# Patient Record
Sex: Female | Born: 1950 | Race: White | Hispanic: No | Marital: Married | Smoking: Never smoker
Health system: Southern US, Community
[De-identification: ages and names within clinical notes are randomized; demographics above are authoritative.]

## PROBLEM LIST (undated history)

## (undated) DIAGNOSIS — C801 Malignant (primary) neoplasm, unspecified: Secondary | ICD-10-CM

## (undated) DIAGNOSIS — N840 Polyp of corpus uteri: Secondary | ICD-10-CM

## (undated) DIAGNOSIS — J302 Other seasonal allergic rhinitis: Secondary | ICD-10-CM

## (undated) DIAGNOSIS — M8580 Other specified disorders of bone density and structure, unspecified site: Secondary | ICD-10-CM

## (undated) DIAGNOSIS — M533 Sacrococcygeal disorders, not elsewhere classified: Secondary | ICD-10-CM

## (undated) DIAGNOSIS — C73 Malignant neoplasm of thyroid gland: Secondary | ICD-10-CM

## (undated) DIAGNOSIS — L80 Vitiligo: Secondary | ICD-10-CM

## (undated) DIAGNOSIS — J45909 Unspecified asthma, uncomplicated: Secondary | ICD-10-CM

## (undated) DIAGNOSIS — H9192 Unspecified hearing loss, left ear: Secondary | ICD-10-CM

## (undated) DIAGNOSIS — D126 Benign neoplasm of colon, unspecified: Secondary | ICD-10-CM

## (undated) DIAGNOSIS — I700 Atherosclerosis of aorta: Secondary | ICD-10-CM

## (undated) DIAGNOSIS — K219 Gastro-esophageal reflux disease without esophagitis: Secondary | ICD-10-CM

## (undated) DIAGNOSIS — K589 Irritable bowel syndrome without diarrhea: Secondary | ICD-10-CM

## (undated) DIAGNOSIS — E079 Disorder of thyroid, unspecified: Secondary | ICD-10-CM

## (undated) HISTORY — DX: Malignant (primary) neoplasm, unspecified: C80.1

## (undated) HISTORY — DX: Benign neoplasm of colon, unspecified: D12.6

## (undated) HISTORY — PX: DILATION AND CURETTAGE OF UTERUS: SHX78

## (undated) HISTORY — DX: Other specified disorders of bone density and structure, unspecified site: M85.80

## (undated) HISTORY — DX: Other seasonal allergic rhinitis: J30.2

## (undated) HISTORY — PX: COLONOSCOPY: SHX174

## (undated) HISTORY — DX: Malignant neoplasm of thyroid gland: C73

## (undated) HISTORY — DX: Unspecified asthma, uncomplicated: J45.909

## (undated) HISTORY — DX: Vitiligo: L80

## (undated) HISTORY — DX: Polyp of corpus uteri: N84.0

## (undated) HISTORY — DX: Gastro-esophageal reflux disease without esophagitis: K21.9

## (undated) HISTORY — PX: SHOULDER SURGERY: SHX246

## (undated) HISTORY — DX: Irritable bowel syndrome, unspecified: K58.9

## (undated) HISTORY — DX: Unspecified hearing loss, left ear: H91.92

## (undated) HISTORY — DX: Atherosclerosis of aorta: I70.0

## (undated) HISTORY — DX: Disorder of thyroid, unspecified: E07.9

## (undated) HISTORY — DX: Sacrococcygeal disorders, not elsewhere classified: M53.3

---

## 2006-05-17 DIAGNOSIS — E079 Disorder of thyroid, unspecified: Secondary | ICD-10-CM

## 2006-05-17 DIAGNOSIS — C801 Malignant (primary) neoplasm, unspecified: Secondary | ICD-10-CM

## 2006-05-17 HISTORY — DX: Malignant (primary) neoplasm, unspecified: C80.1

## 2006-05-17 HISTORY — DX: Disorder of thyroid, unspecified: E07.9

## 2006-05-17 HISTORY — PX: TOTAL THYROIDECTOMY: SHX2547

## 2007-05-18 HISTORY — PX: OTHER SURGICAL HISTORY: SHX169

## 2012-08-02 ENCOUNTER — Encounter: Payer: Self-pay | Admitting: Internal Medicine

## 2012-08-28 ENCOUNTER — Encounter: Payer: Self-pay | Admitting: Obstetrics and Gynecology

## 2012-08-28 ENCOUNTER — Ambulatory Visit (INDEPENDENT_AMBULATORY_CARE_PROVIDER_SITE_OTHER): Payer: BC Managed Care – PPO | Admitting: Obstetrics and Gynecology

## 2012-08-28 VITALS — BP 128/70 | Ht 63.5 in | Wt 158.0 lb

## 2012-08-28 DIAGNOSIS — N951 Menopausal and female climacteric states: Secondary | ICD-10-CM

## 2012-08-28 DIAGNOSIS — Z01419 Encounter for gynecological examination (general) (routine) without abnormal findings: Secondary | ICD-10-CM

## 2012-08-28 DIAGNOSIS — Z1231 Encounter for screening mammogram for malignant neoplasm of breast: Secondary | ICD-10-CM

## 2012-08-28 NOTE — Patient Instructions (Signed)

## 2012-08-28 NOTE — Progress Notes (Signed)
Patient ID: Kelly Ingram, female   DOB: [birth year], 62 y.o.   MRN: [id number] 62 y.o.  Married  Caucasian female   [id number] here for annual exam.   Patient took HRT for ten years due to vasomotor symptoms.  Weaned off hormones for one year and hot flashes and night sweats are very strong.  Black cohosh did not work.  Taking a supplement called Symplex F which doesn't work.  Patient is on Synthroid due to thyroid cancer.  Wants to consider going back on HRT.    No LMP recorded. Patient is postmenopausal.          Sexually active: yes  The current method of family planning is post menopausal status.    Exercising:Yoga, stretches, walking  Last mammogram:  09-06-11 in [place]:wnl Last pap smear:07/2009 in [place] wnl History of abnormal pap: no history of abnormal paps Smoking:no Alcohol:3 glasses of wine per week Last colonoscopy:12 years ago in [place]:  Scheduled for 09/2012 with [facility] Last Bone Density:  3-4 yrs. ago Last tetanus shot:6 yrs. ago Last cholesterol check: unsure  Hgb: PCP               Urine:PCP    Health Maintenance  Topic Date Due  . Pap Smear  12/29/1966  . Tetanus/tdap  12/29/1967  . Mammogram  12/29/1998  . Colonoscopy  12/29/1998  . Zostavax  12/28/2008  . Influenza Vaccine  01/15/2013    Family History  Problem Relation Age of Onset  . Thyroid disease Mother   . Hypertension Paternal Grandmother   . Hypertension Paternal Grandfather     There is no problem list on file for this patient.   Past Medical History  Diagnosis Date  . Seasonal allergies   . Cancer 2008    thyroid  . Thyroid disease 2008    hx thyroid cancer--bilateral nodules    Past Surgical History  Procedure Laterality Date  . Total thyroidectomy  2008  . Vocal cord implant Right 2009  . Shoulder surgery Left     Allergies: Review of patient's allergies indicates no known allergies.  Current Outpatient Prescriptions  Medication Sig Dispense Refill  .  levothyroxine (SYNTHROID, LEVOTHROID) 125 MCG tablet Take 125 mcg by mouth daily before breakfast.      . loratadine (CLARITIN) 10 MG tablet Take 10 mg by mouth daily.       No current facility-administered medications for this visit.    ROS: Pertinent items are noted in HPI.  Social Hx:  Does consulting work.  Plans to increase time in [place].    Exam:    BP 128/70  Ht 5' 3.5" (1.613 m)  Wt 158 lb (71.668 kg)  BMI 27.55 kg/m2   Wt Readings from Last 3 Encounters:  08/28/12 158 lb (71.668 kg)     Ht Readings from Last 3 Encounters:  08/28/12 5' 3.5" (1.613 m)    General appearance: alert, cooperative and appears stated age Head: Normocephalic, without obvious abnormality, atraumatic Neck: no adenopathy, supple, symmetrical, trachea midline and thyroid not enlarged, symmetric, no tenderness/mass/nodules Lungs: clear to auscultation bilaterally Breasts: Inspection negative, No nipple retraction or dimpling, No nipple discharge or bleeding, No axillary or supraclavicular adenopathy, Normal to palpation without dominant masses Heart: regular rate and rhythm Abdomen: soft, non-tender; bowel sounds normal; no masses,  no organomegaly Extremities: extremities normal, atraumatic, no cyanosis or edema Skin: Skin color, texture, turgor normal. No rashes or lesions Lymph nodes: Cervical, supraclavicular, and axillary nodes normal. No abnormal  inguinal nodes palpated Neurologic: Grossly normal   Pelvic: External genitalia:  no lesions              Urethra:  normal appearing urethra with no masses, tenderness or lesions              Bartholins and Skenes: normal                 Vagina: normal appearing vagina with normal color and discharge, no lesions              Cervix: normal appearance              Pap taken: yes and HR HPV.        Bimanual Exam:  Uterus:  uterus is normal size, shape, consistency and nontender                                      Adnexa: normal adnexa in size,  nontender and no masses                                      Rectovaginal: Confirms                                      Anus:  normal sphincter tone, no lesions  A: normal gyn exam Menopausal symptoms.  Desires to return to HRT.  Risks and benefits and Women's Health Initiative have been discussed in detail with patient.  Declines Effexor or Zoloft.  [facility] and HR HPV testing return annually or prn   After mammogram is back and if normal, will prescribe MiniVelle .0375 mg twice a week and prometrium 100 mg daily.  An After Visit Summary was printed and given to the patient.

## 2012-08-30 ENCOUNTER — Encounter: Payer: Self-pay | Admitting: Obstetrics and Gynecology

## 2012-08-31 ENCOUNTER — Other Ambulatory Visit: Payer: Self-pay | Admitting: Obstetrics and Gynecology

## 2012-08-31 ENCOUNTER — Encounter: Payer: Self-pay | Admitting: Obstetrics and Gynecology

## 2012-08-31 DIAGNOSIS — N951 Menopausal and female climacteric states: Secondary | ICD-10-CM

## 2012-08-31 MED ORDER — ESTRADIOL 0.0375 MG/24HR TD PTTW: 1.0000 | MEDICATED_PATCH | TRANSDERMAL | Status: DC

## 2012-08-31 MED ORDER — PROGESTERONE MICRONIZED 100 MG PO CAPS: 100.0000 mg | ORAL_CAPSULE | Freq: Every day | ORAL | Status: DC

## 2012-09-01 ENCOUNTER — Encounter: Payer: Self-pay | Admitting: Obstetrics and Gynecology

## 2012-09-04 ENCOUNTER — Other Ambulatory Visit: Payer: Self-pay | Admitting: Obstetrics and Gynecology

## 2012-09-04 ENCOUNTER — Telehealth: Payer: Self-pay | Admitting: Orthopedic Surgery

## 2012-09-04 DIAGNOSIS — N951 Menopausal and female climacteric states: Secondary | ICD-10-CM

## 2012-09-04 MED ORDER — PROGESTERONE MICRONIZED 100 MG PO CAPS: 100.0000 mg | ORAL_CAPSULE | Freq: Every day | ORAL | Status: DC

## 2012-09-04 MED ORDER — ESTRADIOL 0.0375 MG/24HR TD PTTW: 1.0000 | MEDICATED_PATCH | TRANSDERMAL | Status: DC

## 2012-09-04 NOTE — Telephone Encounter (Signed)
Spoke with pt to let her know we sent her RX for prometrium and minivelle for 1 month supply to [facility]. Pt appreciative.

## 2012-09-04 NOTE — Telephone Encounter (Signed)
LMTCB about the pharmacy she uses so we can refill her medications.

## 2012-09-05 NOTE — Telephone Encounter (Signed)
pt is waiting on prior authorization pt wants to know if she should start taking the other medication while she is waiting

## 2012-09-06 ENCOUNTER — Telehealth: Payer: Self-pay | Admitting: Obstetrics and Gynecology

## 2012-09-06 NOTE — Telephone Encounter (Signed)
Kennon Rounds,  Thank you.  I just went to the prescription samples to check for the [facility] as well!  We are on the same page.  [employer]

## 2012-09-06 NOTE — Telephone Encounter (Signed)
cancel

## 2012-09-06 NOTE — Telephone Encounter (Signed)
Pt returning Amy's call re: prior auth status/[place]

## 2012-09-06 NOTE — Telephone Encounter (Signed)
Please advise. Will start prior auth process today. See bellow patient question. Will send in [place] today. sue

## 2012-09-06 NOTE — Telephone Encounter (Signed)
Patient still waiting on prior authorization for Minivelle.  Meanwhile, hot flashes very bothersome so she wants to know if she may begin Prometrium or if this would be changed if Minivelle does not get authorized. Advised she could try sample of Minivelle but have no way of knowing if she will be starting a medication that may ultimately need to be changed if prior auth does not go thru.  Also advised to begin Prometrium when starts the samples.  One month supply Minivelle 0.0375mg  left at desk for patient with savings card. [id number] exp 06-2013, 2 boxes.

## 2012-09-07 ENCOUNTER — Encounter: Payer: Self-pay | Admitting: Internal Medicine

## 2012-09-07 ENCOUNTER — Ambulatory Visit (AMBULATORY_SURGERY_CENTER): Payer: BC Managed Care – PPO | Admitting: *Deleted

## 2012-09-07 ENCOUNTER — Telehealth: Payer: Self-pay | Admitting: *Deleted

## 2012-09-07 VITALS — Ht 64.0 in | Wt 157.6 lb

## 2012-09-07 DIAGNOSIS — Z1211 Encounter for screening for malignant neoplasm of colon: Secondary | ICD-10-CM

## 2012-09-07 MED ORDER — MOVIPREP 100 G PO SOLR: 1.0000 | Freq: Once | ORAL | Status: DC

## 2012-09-07 NOTE — Telephone Encounter (Signed)
Please advise on prior authorization for Minivelle patch. Will fax when complete. ?chart location. See phone note in EPIC. sue

## 2012-09-07 NOTE — Progress Notes (Signed)
No egg or soy allergy. ewm No problems with sedation in the past. ewm

## 2012-09-08 NOTE — Telephone Encounter (Signed)
Kelly Ingram,  Thank you for sending in the prior authorization.  [employer]

## 2012-09-11 ENCOUNTER — Encounter: Payer: Self-pay | Admitting: Internal Medicine

## 2012-09-13 ENCOUNTER — Telehealth: Payer: Self-pay

## 2012-09-13 MED ORDER — DICYCLOMINE HCL 20 MG PO TABS: 20.0000 mg | ORAL_TABLET | Freq: Four times a day (QID) | ORAL | Status: DC

## 2012-09-13 NOTE — Telephone Encounter (Signed)
Pt aware and script sent to the pharmacy. 

## 2012-09-13 NOTE — Telephone Encounter (Signed)
Pt would like the prescriptions for the antispasmodics now so she will not have to go to the pharmacy post procedure. Please advise.

## 2012-09-13 NOTE — Telephone Encounter (Signed)
We can prescribe antispasmodics post procedure to help her "gut calm down"

## 2012-09-13 NOTE — Telephone Encounter (Signed)
Pt is scheduled for a colon May 8th. 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 My stools are soft and I have 2-4 bowel movements a day. The last time I had a colonoscopy, it took a week for my gut to "calm down." How can I modify prep this time so it will be less harsh on me?   I replied that we usually do not modify the prep but the pt sent another request today stating the following:     Bonita Quin - thanks for your reply. I think all health care should be tailored to a patient's individual needs, particularly when there's a history of problems. Prior to my prep and procedure, I'd like to discuss my case with Dr. Marina Goodell either by Earleen Reaper, phone or an appointment.   Dr. Marina Goodell please advise.

## 2012-09-13 NOTE — Telephone Encounter (Signed)
Bentyl 20 mg, dispense 30. One by mouth every 6 hours when necessary. No refills

## 2012-09-19 ENCOUNTER — Encounter: Payer: Self-pay | Admitting: Obstetrics and Gynecology

## 2012-09-21 ENCOUNTER — Encounter: Payer: Self-pay | Admitting: Internal Medicine

## 2012-09-21 ENCOUNTER — Ambulatory Visit (AMBULATORY_SURGERY_CENTER): Payer: BC Managed Care – PPO | Admitting: Internal Medicine

## 2012-09-21 VITALS — BP 128/69 | HR 66 | Temp 97.2°F | Resp 17 | Ht 64.0 in | Wt 157.0 lb

## 2012-09-21 DIAGNOSIS — Z1211 Encounter for screening for malignant neoplasm of colon: Secondary | ICD-10-CM

## 2012-09-21 MED ORDER — SODIUM CHLORIDE 0.9 % IV SOLN: 500.0000 mL | INTRAVENOUS | Status: DC

## 2012-09-21 NOTE — Progress Notes (Signed)
Patient did not have preoperative order for IV antibiotic SSI prophylaxis. (G8918)  Patient did not experience any of the following events: a burn prior to discharge; a fall within the facility; wrong site/side/patient/procedure/implant event; or a hospital transfer or hospital admission upon discharge from the facility. (G8907)  

## 2012-09-21 NOTE — Progress Notes (Deleted)
YOU HAD AN ENDOSCOPIC PROCEDURE TODAY AT THE  ENDOSCOPY CENTER: Refer to the procedure report that was given to you for any specific questions about what was found during the examination.  If the procedure report does not answer your questions, please call your gastroenterologist to clarify.  If you requested that your care partner not be given the details of your procedure findings, then the procedure report has been included in a sealed envelope for you to review at your convenience later.  YOU SHOULD EXPECT: Some feelings of bloating in the abdomen. Passage of more gas than usual.  Walking can help get rid of the air that was put into your GI tract during the procedure and reduce the bloating. If you had a lower endoscopy (such as a colonoscopy or flexible sigmoidoscopy) you may notice spotting of blood in your stool or on the toilet paper. If you underwent a bowel prep for your procedure, then you may not have a normal bowel movement for a few days.  DIET: Your first meal following the procedure should be a light meal and then it is ok to progress to your normal diet.  A half-sandwich or bowl of soup is an example of a good first meal.  Heavy or fried foods are harder to digest and may make you feel nauseous or bloated.  Likewise meals heavy in dairy and vegetables can cause extra gas to form and this can also increase the bloating.  Drink plenty of fluids but you should avoid alcoholic beverages for 24 hours.  TRY TO INCREASE THE FIBER IN YOUR DIET.  ACTIVITY: Your care partner should take you home directly after the procedure.  You should plan to take it easy, moving slowly for the rest of the day.  You can resume normal activity the day after the procedure however you should NOT DRIVE or use heavy machinery for 24 hours (because of the sedation medicines used during the test).    SYMPTOMS TO REPORT IMMEDIATELY: A gastroenterologist can be reached at any hour.  During normal business hours,  8:30 AM to 5:00 PM Monday through Friday, call [phone].  After hours and on weekends, please call the GI answering service at [phone] who will take a message and have the physician on call contact you.   Following lower endoscopy (colonoscopy or flexible sigmoidoscopy):  Excessive amounts of blood in the stool  Significant tenderness or worsening of abdominal pains  Swelling of the abdomen that is new, acute  Fever of 100F or higher  FOLLOW UP: If any biopsies were taken you will be contacted by phone or by letter within the next 1-3 weeks.  Call your gastroenterologist if you have not heard about the biopsies in 3 weeks.  Our staff will call the home number listed on your records the next business day following your procedure to check on you and address any questions or concerns that you may have at that time regarding the information given to you following your procedure. This is a courtesy call and so if there is no answer at the home number and we have not heard from you through the emergency physician on call, we will assume that you have returned to your regular daily activities without incident.  SIGNATURES/CONFIDENTIALITY: You and/or your care partner have signed paperwork which will be entered into your electronic medical record.  These signatures attest to the fact that that the information above on your After Visit Summary has been reviewed and is   understood.  Full responsibility of the confidentiality of this discharge information lies with you and/or your care-partner. 

## 2012-09-21 NOTE — Op Note (Signed)
[facility] [address] [place], [address]   COLONOSCOPY PROCEDURE REPORT  PATIENT: Kelly Ingram, Kelly Ingram  MR#: [id number] BIRTHDATE: 10-01-1950 , 61  yrs. old GENDER: Female ENDOSCOPIST: Roxy Cedar, MD REFERRED MW:[id number] Evlyn Kanner, M.D. PROCEDURE DATE:  09/21/2012 PROCEDURE:   Colonoscopy, screening ASA CLASS:   Class II INDICATIONS:Average risk patient for colon cancer.   Negative exam elsewhere 12 yrs ago MEDICATIONS: MAC sedation, administered by CRNA and propofol (Diprivan) 200mg  IV  DESCRIPTION OF PROCEDURE:   After the risks benefits and alternatives of the procedure were thoroughly explained, informed consent was obtained.  A digital rectal exam revealed no abnormalities of the rectum.   The LB [id number]  endoscope was introduced through the anus and advanced to the cecum, which was identified by both the appendix and ileocecal valve. No adverse events experienced.   The quality of the prep was excellent, using MoviPrep  The instrument was then slowly withdrawn as the colon was fully examined.      COLON FINDINGS: Mild diverticulosis was noted in the sigmoid colon. The colon was otherwise normal.  There was no   inflammation, polyps or cancers .  Retroflexed views revealed internal hemorrhoids. The time to cecum=4 minutes 33 seconds.  Withdrawal time=8 minutes 48 seconds.  The scope was withdrawn and the procedure completed.  COMPLICATIONS: There were no complications.  ENDOSCOPIC IMPRESSION: 1.   Mild diverticulosis was noted in the sigmoid colon 2.   The colon was otherwise normal  RECOMMENDATIONS: 1. Continue current colorectal screening recommendations for "routine risk" patients with a repeat colonoscopy in 10 years.   eSigned:  Roxy Cedar, MD 09/21/2012 11:03 AM   cc: Adrian Prince, MD and The Patient

## 2012-09-21 NOTE — Patient Instructions (Addendum)
YOU HAD AN ENDOSCOPIC PROCEDURE TODAY AT THE [facility]: Refer to the procedure report that was given to you for any specific questions about what was found during the examination.  If the procedure report does not answer your questions, please call your gastroenterologist to clarify.  If you requested that your care partner not be given the details of your procedure findings, then the procedure report has been included in a sealed envelope for you to review at your convenience later.  YOU SHOULD EXPECT: Some feelings of bloating in the abdomen. Passage of more gas than usual.  Walking can help get rid of the air that was put into your GI tract during the procedure and reduce the bloating. If you had a lower endoscopy (such as a colonoscopy or flexible sigmoidoscopy) you may notice spotting of blood in your stool or on the toilet paper. If you underwent a bowel prep for your procedure, then you may not have a normal bowel movement for a few days.  DIET: Your first meal following the procedure should be a light meal and then it is ok to progress to your normal diet.  A half-sandwich or bowl of soup is an example of a good first meal.  Heavy or fried foods are harder to digest and may make you feel nauseous or bloated.  Likewise meals heavy in dairy and vegetables can cause extra gas to form and this can also increase the bloating.  Drink plenty of fluids but you should avoid alcoholic beverages for 24 hours. PLEASE, TRY TO INCREASE THE FIBER IN YOUR DIET.  ACTIVITY: Your care partner should take you home directly after the procedure.  You should plan to take it easy, moving slowly for the rest of the day.  You can resume normal activity the day after the procedure however you should NOT DRIVE or use heavy machinery for 24 hours (because of the sedation medicines used during the test).    SYMPTOMS TO REPORT IMMEDIATELY: A gastroenterologist can be reached at any hour.  During normal business  hours, 8:30 AM to 5:00 PM Monday through Friday, call [phone].  After hours and on weekends, please call the GI answering service at [phone] who will take a message and have the physician on call contact you.   Following lower endoscopy (colonoscopy or flexible sigmoidoscopy):  Excessive amounts of blood in the stool  Significant tenderness or worsening of abdominal pains  Swelling of the abdomen that is new, acute  Fever of 100F or higher  FOLLOW UP: If any biopsies were taken you will be contacted by phone or by letter within the next 1-3 weeks.  Call your gastroenterologist if you have not heard about the biopsies in 3 weeks.  Our staff will call the home number listed on your records the next business day following your procedure to check on you and address any questions or concerns that you may have at that time regarding the information given to you following your procedure. This is a courtesy call and so if there is no answer at the home number and we have not heard from you through the emergency physician on call, we will assume that you have returned to your regular daily activities without incident.  SIGNATURES/CONFIDENTIALITY: You and/or your care partner have signed paperwork which will be entered into your electronic medical record.  These signatures attest to the fact that that the information above on your After Visit Summary has been reviewed and is  understood.  Full responsibility of the confidentiality of this discharge information lies with you and/or your care-partner. 

## 2012-09-22 ENCOUNTER — Telehealth: Payer: Self-pay | Admitting: *Deleted

## 2012-09-22 NOTE — Telephone Encounter (Signed)
  Follow up Call-  Call back number 09/21/2012  Post procedure Call Back phone  # [phone]  Permission to leave phone message Yes     Patient questions:  Do you have a fever, pain , or abdominal swelling? no Pain Score  0 *  Have you tolerated food without any problems? yes  Have you been able to return to your normal activities? yes  Do you have any questions about your discharge instructions: Diet   no Medications  no Follow up visit  no  Do you have questions or concerns about your Care? no  Actions: * If pain score is 4 or above: No action needed, pain <4.

## 2012-09-22 NOTE — Telephone Encounter (Signed)
  Follow up Call-  Call back number 09/21/2012  Post procedure Call Back phone  # [phone]  Permission to leave phone message Yes     Patient questions:  Do you have a fever, pain , or abdominal swelling? no Pain Score  0 *  Have you tolerated food without any problems? Yes   Have you been able to return to your normal activities? yes  Do you have any questions about your discharge instructions: Diet   no Medications  no Follow up visit  no  Do you have questions or concerns about your Care? no  Actions: * If pain score is 4 or above: No action needed, pain <4.

## 2012-09-28 ENCOUNTER — Ambulatory Visit
Admission: RE | Admit: 2012-09-28 | Discharge: 2012-09-28 | Disposition: A | Discharge status: Home / Self Care | Payer: BC Managed Care – PPO | Source: Ambulatory Visit | Attending: Obstetrics and Gynecology | Admitting: Obstetrics and Gynecology

## 2012-09-28 DIAGNOSIS — Z1231 Encounter for screening mammogram for malignant neoplasm of breast: Secondary | ICD-10-CM

## 2012-10-05 ENCOUNTER — Other Ambulatory Visit: Payer: Self-pay | Admitting: Obstetrics and Gynecology

## 2012-10-05 ENCOUNTER — Telehealth: Payer: Self-pay | Admitting: Obstetrics and Gynecology

## 2012-10-05 DIAGNOSIS — N951 Menopausal and female climacteric states: Secondary | ICD-10-CM

## 2012-10-05 MED ORDER — PROGESTERONE MICRONIZED 100 MG PO CAPS: 100.0000 mg | ORAL_CAPSULE | Freq: Every day | ORAL | Status: DC

## 2012-10-05 MED ORDER — ESTRADIOL 0.05 MG/24HR TD PTWK: 1.0000 | MEDICATED_PATCH | TRANSDERMAL | Status: DC

## 2012-10-05 NOTE — Telephone Encounter (Signed)
Spoke with Kelly Ingram about MMG results being normal. Rec screening MMG in a year. Kelly Ingram pleased. Kelly Ingram doing much better on HRT. Night sweats and hot flashes diminishing. Kelly Ingram needs refill of prometrium, will take last one tonight. Kelly Ingram was using samples of Minivelle. Kelly Ingram says insurance won't cover minivelle, and Dr. Edward Jolly had mentioned another patch that could be used weekly instead. Please advise.

## 2012-10-05 NOTE — Telephone Encounter (Signed)
Please inform patient I will send RX to pharmacy for Prometrium and Climara patch (to be used weekly).

## 2012-10-05 NOTE — Telephone Encounter (Signed)
Patient needs prescription for Prometrium plus her "patch" (on last pill of Prometrium).  Also needs results of mammogram.

## 2012-10-06 ENCOUNTER — Other Ambulatory Visit: Payer: Self-pay | Admitting: Obstetrics and Gynecology

## 2012-10-06 NOTE — Telephone Encounter (Signed)
I just spoke to the patient personally by phone to let her know that I confirmed the Prometrium is now available at her [facility].  The order that I had placed for this did not go through as I had not signed the final order.  I apologized to the patient for the delay.

## 2012-10-06 NOTE — Telephone Encounter (Signed)
LM on pt's VM that Rx's were sent to pharmacy. Pt to call back with any problems.

## 2012-10-06 NOTE — Telephone Encounter (Signed)
Patient stated that she used MyChart to document that she was having issues getting her medications refilled. She stated that she communicated most recently with Bonita Quin concerning this matter. Patient stated that she has been waiting to hear back from Amy, but that Amy never contacted her back. Patient stated that after she spoke with Bonita Quin and made it known that she needed both of her medications refilled, she did get the estrogen patch filled. However, the prometrium still needs to be filled. Patient stated that she uses the [facility] on [address] and that she is leaving to go out of town two hours from now. [place]

## 2012-10-17 ENCOUNTER — Other Ambulatory Visit: Payer: Self-pay | Admitting: Endocrinology

## 2012-10-17 DIAGNOSIS — E890 Postprocedural hypothyroidism: Secondary | ICD-10-CM

## 2013-01-23 ENCOUNTER — Ambulatory Visit
Admission: RE | Admit: 2013-01-23 | Discharge: 2013-01-23 | Disposition: A | Discharge status: Home / Self Care | Payer: BC Managed Care – PPO | Source: Ambulatory Visit | Attending: Endocrinology | Admitting: Endocrinology

## 2013-01-23 DIAGNOSIS — E890 Postprocedural hypothyroidism: Secondary | ICD-10-CM

## 2013-03-22 ENCOUNTER — Other Ambulatory Visit: Payer: Self-pay

## 2013-05-24 ENCOUNTER — Other Ambulatory Visit: Payer: Self-pay | Admitting: Obstetrics and Gynecology

## 2013-05-24 ENCOUNTER — Telehealth: Payer: Self-pay | Admitting: Obstetrics and Gynecology

## 2013-05-24 DIAGNOSIS — N950 Postmenopausal bleeding: Secondary | ICD-10-CM

## 2013-05-24 NOTE — Telephone Encounter (Signed)
I will put in an order for a sonohysterogram and an endometrial biopsy. Please let the patient know that we will likely be doing all of these when she comes in for her visit with me.

## 2013-05-24 NOTE — Telephone Encounter (Signed)
Call to patient and notified of Dr Elza Rafter plan to schedule for PUS/poss SHGM and endo BX when here. Take motrin 800 mg 1 hour prior with food. Chart to precert.

## 2013-05-24 NOTE — Telephone Encounter (Signed)
Return call to patient.  She reports menopausal for last 12 years. Started spotting about 2 months ago. Has increased to actual red flow over last two weeks.  Using a pad now but still not heavy. Needs one pad per day. Was dark and just on tissue. Has had this happen before several years ago and needed out-patient surgical procedure but it did stop the bleeding. She is traveling in [place] and will not be home till late on 06-04-13. Advised if begins heavy bleeding seek care at ED or urgent care. Agreeable.  Appt scheduled with Dr Quincy Simmonds on 06-07-13 at 1100.  (this is an ultrasound slot but was first available when she returns to town).  Anything else to be done? Should I schedule PUS?

## 2013-05-24 NOTE — Telephone Encounter (Signed)
Patient calling re: has been having "spotting and bleeding for last several weeks." Patient is post menopausal. Patient is out of town and won't be back for another two weeks.

## 2013-05-25 NOTE — Telephone Encounter (Signed)
Call to patient/ advised of $25 PR for visit/also advised that appointment has been moved to 10am and that there is a $150 cancellation penalty//ssf

## 2013-05-25 NOTE — Telephone Encounter (Signed)
Call to patient, advised

## 2013-06-07 ENCOUNTER — Telehealth: Payer: Self-pay | Admitting: Obstetrics and Gynecology

## 2013-06-07 ENCOUNTER — Ambulatory Visit (INDEPENDENT_AMBULATORY_CARE_PROVIDER_SITE_OTHER): Payer: BC Managed Care – PPO

## 2013-06-07 ENCOUNTER — Ambulatory Visit: Payer: BC Managed Care – PPO | Admitting: Obstetrics and Gynecology

## 2013-06-07 ENCOUNTER — Ambulatory Visit (INDEPENDENT_AMBULATORY_CARE_PROVIDER_SITE_OTHER): Payer: BC Managed Care – PPO | Admitting: Obstetrics and Gynecology

## 2013-06-07 DIAGNOSIS — N950 Postmenopausal bleeding: Secondary | ICD-10-CM

## 2013-06-07 NOTE — Progress Notes (Signed)
  Subjective  Patient is here for pelvic ultrasound, sonohysterogram, and endometrial biopsy for postmenopausal bleeding on HRT. Patient re-initiated HRT in May 2014 after approximately one year hiatus off HRT.  Had terrible hot flashes and night sweats and requesting restarting HRT. After complete discussion regarding risks and benefits, started HRT again.  On Climara 0.05 mg patch weekly and Prometirum 100 mg daily.  No missed dosages or problems with patch falling off. Bled briefly in the Fall 2014 and then had bled for the past month.  States history of hysteroscopy with dilation and curettage in [place] for what she believes was an endometrial polyp.   Wondering if needs to come off HRT.  Objective  Ultrasound performed.  Uterus 7.96 x 4.32 x 4.19 cm.  EMS 7.3 mm, feeder vessel noted.  Fibroid 1.6 x 1.36 x 1.56 cm intramural.  Left ovary 1.94 x 1.3 x 0.82 cm.  Right ovary 2.13 x 0.91 x 0.94 cm.  Sonohysterogram and EMB  Consent for procedures performed. Speculum placed in vagina. Cervix sterilized with betadine.  Cannula placed in uterine cavity without difficulty. Speculum withdrawn. Saline injected. Filling defect noted.  Speculum replaced. Cervix resterilized. Tenaculum to anterior cervical lip. Pipelle passed to 8 cm twice. Tissue to pathology. Minimal EBL. No complications to procedures.  Assessment  Postmenopausal bleeding. Suspected endometrial polyp. History of prior hysteroscopy with dilation and curettage.  Plan  Patient informed that she may get a phone call next week in my absence regarding her test results. She will follow up with me in approx. 10 days for a talk.

## 2013-06-07 NOTE — Telephone Encounter (Signed)
Patient needs a consult appointment with Dr. Quincy Simmonds (06/22/13 thru 06/22/13 ) to discuss biopsy results. No appointments available.

## 2013-06-07 NOTE — Patient Instructions (Signed)
Call for fever, increasing pain, or heavy bleeding.

## 2013-06-08 NOTE — Telephone Encounter (Signed)
Spoke with patient. Office visit scheduled for 2/4 at 0815 with Dr. Quincy Simmonds. Patient aware.  Time per Gay Filler, Dr. Quincy Simmonds does not have to make rounds that morning.  Routing to provider for final review. Patient agreeable to disposition. Will close encounter

## 2013-06-08 NOTE — Telephone Encounter (Signed)
Thank you for using my time slots well.

## 2013-06-08 NOTE — Telephone Encounter (Signed)
Patient calling to check on status of request. She is available anytime the week of 06/22/13. No slots have opened up at this time.

## 2013-06-11 LAB — IPS CERVICAL/ECC/EMB/VULVAR/VAGINAL BIOPSY

## 2013-06-15 ENCOUNTER — Telehealth: Payer: Self-pay | Admitting: Obstetrics and Gynecology

## 2013-06-15 NOTE — Telephone Encounter (Signed)
Spoke with patient and message from Dr. Sabra Heck given.  Has follow up appointment with Dr. Quincy Simmonds.  Routing to provider for final review. Patient agreeable to disposition. Will close encounter

## 2013-06-15 NOTE — Telephone Encounter (Signed)
Pt had a biopsy last week and is calling to get her results

## 2013-06-15 NOTE — Telephone Encounter (Signed)
Agree.  Encounter closed. 

## 2013-06-15 NOTE — Telephone Encounter (Signed)
Message copied by Michele Mcalpine on Fri Jun 15, 2013 10:33 AM ------      Message from: Megan Salon      Created: Wed Jun 13, 2013  4:13 PM       Inform pt biopsy showed no abnormal cells but an endometrial polyp.  She has follow up appointment scheduled with Dr. Quincy Simmonds to discuss the next step. ------

## 2013-06-17 DIAGNOSIS — N840 Polyp of corpus uteri: Secondary | ICD-10-CM

## 2013-06-17 HISTORY — DX: Polyp of corpus uteri: N84.0

## 2013-06-20 ENCOUNTER — Ambulatory Visit (INDEPENDENT_AMBULATORY_CARE_PROVIDER_SITE_OTHER): Payer: BC Managed Care – PPO | Admitting: Obstetrics and Gynecology

## 2013-06-20 ENCOUNTER — Encounter: Payer: Self-pay | Admitting: Obstetrics and Gynecology

## 2013-06-20 ENCOUNTER — Telehealth: Payer: Self-pay | Admitting: *Deleted

## 2013-06-20 VITALS — BP 123/82 | HR 61 | Resp 14 | Wt 159.0 lb

## 2013-06-20 DIAGNOSIS — N950 Postmenopausal bleeding: Secondary | ICD-10-CM

## 2013-06-20 DIAGNOSIS — N840 Polyp of corpus uteri: Secondary | ICD-10-CM

## 2013-06-20 NOTE — Telephone Encounter (Signed)
Call to patient and advised that due to hospital availability, unable to move surgery up at this point.  Plan to proceed with 07-03-13 at 1215 and if there is an cancellation, we will move her up.  Instructions given and will mail copy.  Patient declined pre-op stating that Dr Quincy Simmonds was very through with her today and took care of all info related to surgery. Post op scheduled. Advised will confirm info regarding pre-op with Dr Quincy Simmonds.

## 2013-06-20 NOTE — Progress Notes (Signed)
Patient ID: Kelly Ingram, female   DOB: 1951-04-08, 63 y.o.   MRN: [id number]  Patient is here for follow up of pelvic ultrasound, sonohysterogram, and endometrial biopsy for postmenopausal bleeding on HRT.  Has bleeding for 2 months.   Patient re-initiated HRT in May 2014 after approximately one year hiatus off HRT.  Had terrible hot flashes and night sweats and requesting restarting HRT. After complete discussion regarding risks and benefits, started HRT again.   On Climara 0.05 mg patch weekly and Prometirum 100 mg daily.   No missed dosages or problems with patch falling off. Bled briefly in the Fall 2014 and then had bled for the past month.   States history of hysteroscopy with dilation and curettage in [place] for what she believes was an endometrial polyp.   Wondering if needs to come off HRT.  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 Patient has a vocal cord implant - gortex implant.    Objective  Husband present for discussion.  Ultrasound performed 06/07/13 - Uterus 7.96 x 4.32 x 4.19 cm.  EMS 7.3 mm, feeder vessel noted.  Fibroid 1.6 x 1.36 x 1.56 cm intramural.  Left ovary 1.94 x 1.3 x 0.82 cm.  Right ovary 2.13 x 0.91 x 0.94 cm.  EMB performed 06/07/13 -   COMMENTS: [facility]     Comments: [address], [place] [address] Tel: [phone]  Fax: [phone] SURGICAL PATHOLOGY REPORT  PATIENT NAME:Ingram, Kelly Cory PATHOLOGY#:[id number]: F DOB: June 25, 1950 (Age: 63) DATE OBTAINED:1/22/2015DOCTOR:Brook Quincy Simmonds, MD DATE RECEIVED:1/23/2015MRN#:[id number] DATE SIGNED:1/26/2015CLIENT:[facility] ACCOUNT [id number] OTHER PHYS:  LOCATION:  PREOPERATIVE DIAGNOSIS:Postmenopausal bleeding, suspected polyp OPERATION:Endometrial biopsy POSTOPERATIVE DIAGNOSIS:Postmenopausal bleeding, suspected polyp SPECIMEN(S): EMB FINAL MICROSCOPIC DIAGNOSIS: ENDOMETRIUM, BIOPSY: -SUPERFICIAL STRIPS OF BENIGN  ENDOMETRIUM WITH SCANT UNDERLYING STROMA, SUGGESTIVE OF ATROPHY -FRAGMENTS CONSISTENT WITH BENIGN ENDOMETRIAL POLYP -DETACHED FRAGMENTS OF BENIGN SQUAMOUS AND ENDOCERVICAL EPITHELIUM -NEGATIVE FOR ATYPIA OR MALIGNANCY  Electronically Signed Out By pe/1/26/2015Paul Warner, M.D.[id number] [address] [place], [place].: Dollene Primrose, MD   GROSS DESCRIPTION: Labeled:  Patient name, date of birth, and "EMBx" Fixative:  Formalin Specimen Type:  Soft, red-tan tissue admixed with mucus and blood clot Specimen Dimensions:  Aggregate 1.5 x 1.5 x 0.4 cm Slide Key:  Submitted in toto in cassette A cb/06/08/2013 SM  Resulting Agency [facility]  Specimen Collected: 06/07/13 12:00 AM Last Resulted: 06/11/13  9:44 AM   Assessment  Postmenopausal bleeding with endometrial polyp History of vocal cord implant.  Plan   Proceed with hysteroscopic polypectomy with dilation and curettage.  I reviewed benefits and risks which include but are not limited to bleeding, infection, uterine perforation requiring observation or laparoscopic surgery, reaction to anesthesia, death, vocal cord trauma, pulmonary edema, hyponatremia, DVT, PE, or need for further surgery.    I recommend an anesthesia consult to discuss possible spinal anesthetic.    Patient wishes to proceed.  She would like to continue on hormone therapy at this time.

## 2013-06-20 NOTE — Telephone Encounter (Signed)
The hospital will require the patient to have been seen for an examination within 30 days of the surgery. I will therefore need to see her for a brief visit.

## 2013-06-20 NOTE — Telephone Encounter (Signed)
Telephoned patient. Advised of quoted insurance benefits/liability for physicians charges for upcoming surgery. PR $563.27. Patient agreeable. Paid in Full.//ssf

## 2013-06-20 NOTE — Patient Instructions (Signed)
We will contact you regarding your surgery scheduling.

## 2013-06-20 NOTE — Telephone Encounter (Signed)
Call to patient and notified that surgical procedure is currently scheduled for Tues 07-03-13 at 1215.  Unable to get scheduled for next week at this point but still working on it.  Wanted her to have this information so far since hospital may call her regarding PAT appt.  Will call patient back later today with additional information/confirmation.

## 2013-06-21 ENCOUNTER — Encounter (HOSPITAL_COMMUNITY): Payer: Self-pay

## 2013-06-21 NOTE — Telephone Encounter (Signed)
Spoke with pt to schedule brief pre-op appt so Dr. Quincy Simmonds can examine her. Pt agreeable to 06-28-13 OV at noon.

## 2013-06-21 NOTE — Telephone Encounter (Signed)
Encounter closed

## 2013-06-28 ENCOUNTER — Encounter: Payer: Self-pay | Admitting: Obstetrics and Gynecology

## 2013-06-28 ENCOUNTER — Ambulatory Visit (INDEPENDENT_AMBULATORY_CARE_PROVIDER_SITE_OTHER): Payer: BC Managed Care – PPO | Admitting: Obstetrics and Gynecology

## 2013-06-28 ENCOUNTER — Other Ambulatory Visit: Payer: Self-pay

## 2013-06-28 ENCOUNTER — Encounter (HOSPITAL_COMMUNITY)
Admission: RE | Admit: 2013-06-28 | Discharge: 2013-06-28 | Disposition: A | Discharge status: Home / Self Care | Payer: BC Managed Care – PPO | Source: Ambulatory Visit | Attending: Obstetrics and Gynecology | Admitting: Obstetrics and Gynecology

## 2013-06-28 ENCOUNTER — Encounter (HOSPITAL_COMMUNITY): Payer: Self-pay

## 2013-06-28 VITALS — BP 124/68 | Wt 160.0 lb

## 2013-06-28 DIAGNOSIS — Z01810 Encounter for preprocedural cardiovascular examination: Secondary | ICD-10-CM | POA: Insufficient documentation

## 2013-06-28 DIAGNOSIS — N950 Postmenopausal bleeding: Secondary | ICD-10-CM

## 2013-06-28 DIAGNOSIS — N840 Polyp of corpus uteri: Secondary | ICD-10-CM

## 2013-06-28 DIAGNOSIS — Z01812 Encounter for preprocedural laboratory examination: Secondary | ICD-10-CM | POA: Insufficient documentation

## 2013-06-28 LAB — CBC
HEMATOCRIT: 46.9 % — AB (ref 36.0–46.0)
HEMOGLOBIN: 16.1 g/dL — AB (ref 12.0–15.0)
MCH: 29.7 pg (ref 26.0–34.0)
MCHC: 34.3 g/dL (ref 30.0–36.0)
MCV: 86.5 fL (ref 78.0–100.0)
Platelets: 322 10*3/uL (ref 150–400)
RBC: 5.42 MIL/uL — ABNORMAL HIGH (ref 3.87–5.11)
RDW: 13.6 % (ref 11.5–15.5)
WBC: 8.9 10*3/uL (ref 4.0–10.5)

## 2013-06-28 MED ORDER — METRONIDAZOLE 0.75 % VA GEL: 1.0000 | Freq: Every day | VAGINAL | Status: DC

## 2013-06-28 NOTE — Patient Instructions (Signed)
20 Kelly Ingram  06/28/2013   Your procedure is scheduled on:  07/03/13  Enter through the Main Entrance of [facility] [place] up the phone at the desk and dial [phone].   Call this number if you have problems the morning of surgery: [phone]   Remember:   Do not eat food:After Midnight.  Do not drink clear liquids: 4 Hours before arrival.  Take these medicines the morning of surgery with A SIP OF WATER: Synthroid   Do not wear jewelry, make-up or nail polish.  Do not wear lotions, powders, or perfumes. You may wear deodorant.  Do not shave 48 hours prior to surgery.  Do not bring valuables to the hospital.  [facility] is not   responsible for any belongings or valuables brought to the hospital.  Contacts, dentures or bridgework may not be worn into surgery.  Leave suitcase in the car. After surgery it may be brought to your room.  For patients admitted to the hospital, checkout time is 11:00 AM the day of              discharge.   Patients discharged the day of surgery will not be allowed to drive             home.  Name and phone number of your driver: husband   [name]  Special Instructions:      Please read over the following fact sheets that you were given:   Surgical Site Infection Prevention

## 2013-06-28 NOTE — Patient Instructions (Signed)
I will see you to day of surgery.

## 2013-06-28 NOTE — Pre-Procedure Instructions (Signed)
EKG reviewed and accepted by Rudean Curt, MD

## 2013-06-28 NOTE — Progress Notes (Signed)
Patient ID: Kelly Ingram, female   DOB: 01/25/1951, 63 y.o.   MRN: [id number]  GYNECOLOGY PROBLEM VISIT  PCP:   Referring provider:   HPI: 63 y.o.   Married  Caucasian  female   G1P0010 with No LMP recorded. Patient is postmenopausal.   here for preop visit for postmenopausal bleeding on HRT. On Climara 0.05 mg and Prometrium 100 mg daily.     Pelvic ultrasound and sonohysterogram on 06/07/13 showed uterus 7.96 x 4.32 x 4.19 cm. EMS 7.3 mm, feeder vessel noted. Fibroid 1.6 x 1.36 x 1.56 cm intramural. Left ovary 1.94 x 1.3 x 0.82 cm. Right ovary 2.13 x 0.91 x 0.94 cm.  Sonohysterogram demonstrated a filling defect 13 x 12 mm. EMB on 06/07/13 confirmed a benign endometrial polyp.  Bleeding stopped one week ago.   Saw anesthesia at [facility] today and is planning for a spinal anesthetic.   Patient reports some vaginal odor - states this after visit completed.   GYNECOLOGIC HISTORY: No LMP recorded. Patient is postmenopausal. Sexually active:  yes Partner preference:  female Contraception:  NA Menopausal hormone therapy:  DES exposure:    Blood transfusions:    Sexually transmitted diseases:    GYN Procedures:  Hysteroscopy with polypectomy Mammogram:     08/28/12 - WNL.             Pap:   08/28/12 - WNL, negative HR HPV.  History of abnormal pap smear:     OB History   Grav Para Term Preterm Abortions TAB SAB Ect Mult Living   1    1              Family History  Problem Relation Age of Onset  . Thyroid disease Mother   . Hypertension Paternal Grandmother   . Hypertension Paternal Grandfather   . Colon cancer Neg Hx     Patient Active Problem List   Diagnosis Date Noted  . Endometrial polyp 06/20/2013  . Postmenopausal bleeding 06/07/2013    Past Medical History  Diagnosis Date  . Seasonal allergies   . Thyroid disease 2008    hx thyroid cancer--bilateral nodules  . IBS (irritable bowel syndrome)   . Cancer 2008    thyroid    Past Surgical  History  Procedure Laterality Date  . Total thyroidectomy  2008  . Vocal cord implant Right 2009  . Shoulder surgery Left   . Colonoscopy      12 yrs ago-normal  . Dilation and curettage of uterus      ALLERGIES: Adhesive.   Current Outpatient Prescriptions  Medication Sig Dispense Refill  . B Complex-C-E (VI-STRESS) TABS Take 1 tablet by mouth daily.      . Cholecalciferol (VITAMIN D PO) Take 2,000 Units by mouth daily.      Marland Kitchen estradiol (CLIMARA - DOSED IN MG/24 HR) 0.05 mg/24hr Place 1 patch (0.05 mg total) onto the skin once a week.  4 patch  10  . fexofenadine (ALLEGRA) 180 MG tablet Take 180 mg by mouth daily.      Marland Kitchen levothyroxine (SYNTHROID, LEVOTHROID) 125 MCG tablet Take 125 mcg by mouth daily before breakfast.      . Lysine 1000 MG TABS Take 1 tablet by mouth daily.      . Misc Natural Products (TRIGOSAMINE FAST-ACTING PO) Take by mouth daily.      . Multiple Vitamin (MULTIVITAMIN WITH MINERALS) TABS tablet Take 1 tablet by mouth daily.      . pimecrolimus (  ELIDEL) 1 % cream Apply 1 application topically 2 (two) times daily.      . polycarbophil (FIBERCON) 625 MG tablet Take 625 mg by mouth daily.      . Probiotic Product (PROBIOTIC DAILY PO) Take 1 capsule by mouth daily.      . progesterone (PROMETRIUM) 100 MG capsule TAKE ONE CAPSULE BY MOUTH DAILY  30 capsule  10  . UNABLE TO FIND Take 1 capsule by mouth daily. Neuro-PS gold       No current facility-administered medications for this visit.     ROS:  Pertinent items are noted in HPI.  SOCIAL HISTORY:  Married.   PHYSICAL EXAMINATION:    BP 124/68  Wt 160 lb (72.576 kg)   Wt Readings from Last 3 Encounters:  06/28/13 160 lb (72.576 kg)  06/28/13 158 lb (71.668 kg)  06/20/13 159 lb (72.122 kg)     Ht Readings from Last 3 Encounters:  06/28/13 5\' 4"  (1.626 m)  09/21/12 5\' 4"  (1.626 m)  09/07/12 5\' 4"  (1.626 m)    General appearance: alert, cooperative and appears stated age Head: Normocephalic, without  obvious abnormality, atraumatic Neck: no adenopathy, supple, symmetrical, trachea midline and thyroid not enlarged, symmetric, no tenderness/mass/nodules Lungs: clear to auscultation bilaterally Breasts: Inspection negative, No nipple retraction or dimpling, No nipple discharge or bleeding, No axillary or supraclavicular adenopathy, Normal to palpation without dominant masses Heart: regular rate and rhythm Abdomen: soft, non-tender; no masses,  no organomegaly Extremities: extremities normal, atraumatic, no cyanosis or edema Skin: Skin color, texture, turgor normal. No rashes or lesions Lymph nodes: Cervical, supraclavicular, and axillary nodes normal. No abnormal inguinal nodes palpated Neurologic: Grossly normal  Pelvic: External genitalia:  no lesions              Urethra:  normal appearing urethra with no masses, tenderness or lesions              Bartholins and Skenes: normal                 Vagina: normal appearing vagina with normal color and discharge, no lesions              Cervix: normal appearance         Bimanual Exam:  Uterus:  uterus is normal size, shape, consistency and nontender                                      Adnexa: normal adnexa in size, nontender and no masses                                      Rectovaginal: Confirms                                      Anus:  normal sphincter tone, no lesions  CBC today - Hgb 16.1, WBC 8.9, platelets 322,000.  ASSESSMENT  Postmenopausal bleeding. Endometrial polyp. Possible bacterial vaginosis.  Elevated Hemoglobin.   PLAN  Proceed with hysteroscopic polypectomy with dilation and curettage on 2/17 15 at [facility].  Risks and benefits have been discussed.  Will treat with Metrogel 0.75% pv at hs from now until surgery.   Medications per Epic orders. Patient will follow up  with PCP regarding elevated hemoglobin.   Return     An After Visit Summary was printed and given to the patient.

## 2013-06-28 NOTE — Anesthesia Preprocedure Evaluation (Signed)
Anesthesia Evaluation  Patient identified by MRN, date of birth, ID band Patient awake    Reviewed: Allergy & Precautions, H&P , Patient's Chart, lab work & pertinent test results, reviewed documented beta blocker date and time   History of Anesthesia Complications Negative for: history of anesthetic complications  Airway Mallampati: II TM Distance: >3 FB Neck ROM: full    Dental   Pulmonary  breath sounds clear to auscultation        Cardiovascular Exercise Tolerance: Good Rhythm:regular Rate:Normal     Neuro/Psych negative psych ROS   GI/Hepatic   Endo/Other    Renal/GU      Musculoskeletal   Abdominal   Peds  Hematology   Anesthesia Other Findings   Reproductive/Obstetrics                           Anesthesia Physical Anesthesia Plan  ASA: II  Anesthesia Plan: Spinal   Post-op Pain Management:    Induction:   Airway Management Planned:   Additional Equipment:   Intra-op Plan:   Post-operative Plan:   Informed Consent: I have reviewed the patients History and Physical, chart, labs and discussed the procedure including the risks, benefits and alternatives for the proposed anesthesia with the patient or authorized representative who has indicated his/her understanding and acceptance.   Dental Advisory Given  Plan Discussed with: CRNA, Surgeon and Anesthesiologist  Anesthesia Plan Comments:        Patient is s/p thyoidectomy with unilateral vocal chord paralysis.  She had a transposition procedure in [place] to move her paralyzed vocal chord into a midline position.  She currently has good phonation, but has difficulty signing as she has limited inflow capabilities on maximum inspiration.  We discussed GA and LMA, versus ETT, and spinal.  She would like to proceed with spinal and sedation and knows that we will have to titrate her sedation according to her respiratory  parameters and that there is a chance that we will not be able to offer her deep sedation.  Her questions were answered. Anesthesia Quick Evaluation

## 2013-07-02 ENCOUNTER — Encounter: Payer: Self-pay | Admitting: Obstetrics and Gynecology

## 2013-07-02 NOTE — H&P (Signed)
Brook E Amundson de Berton Lan, MD at 06/28/2013 12:41 PM      Status: Signed            Patient ID: Kelly Ingram, female   DOB: 09/05/1950, 63 y.o.   MRN: [id number]  GYNECOLOGY PROBLEM VISIT  PCP:   Referring provider:   HPI: 63 y.o.   Married  Caucasian  female    G1P0010 with No LMP recorded. Patient is postmenopausal.    here for preop visit for postmenopausal bleeding on HRT. On Climara 0.05 mg and Prometrium 100 mg daily.       Pelvic ultrasound and sonohysterogram on 06/07/13 showed uterus 7.96 x 4.32 x 4.19 cm. EMS 7.3 mm, feeder vessel noted. Fibroid 1.6 x 1.36 x 1.56 cm intramural. Left ovary 1.94 x 1.3 x 0.82 cm. Right ovary 2.13 x 0.91 x 0.94 cm.  Sonohysterogram demonstrated a filling defect 13 x 12 mm. EMB on 06/07/13 confirmed a benign endometrial polyp.  Bleeding stopped one week ago.   Saw anesthesia at [facility] today and is planning for a spinal anesthetic.   Patient reports some vaginal odor - states this after visit completed.   GYNECOLOGIC HISTORY: No LMP recorded. Patient is postmenopausal. Sexually active:  yes Partner preference:  female Contraception:  NA Menopausal hormone therapy:   DES exposure:     Blood transfusions:     Sexually transmitted diseases:     GYN Procedures:  Hysteroscopy with polypectomy Mammogram:     08/28/12 - WNL.              Pap:   08/28/12 - WNL, negative HR HPV.   History of abnormal pap smear:       OB History     Grav  Para  Term  Preterm  Abortions  TAB  SAB  Ect  Mult  Living     1        1                       Family History   Problem  Relation  Age of Onset   .  Thyroid disease  Mother     .  Hypertension  Paternal Grandmother     .  Hypertension  Paternal Grandfather     .  Colon cancer  Neg Hx         Patient Active Problem List     Diagnosis  Date Noted   .  Endometrial polyp  06/20/2013   .  Postmenopausal bleeding  06/07/2013       Past Medical History   Diagnosis  Date   .   Seasonal allergies     .  Thyroid disease  2008       hx thyroid cancer--bilateral nodules   .  IBS (irritable bowel syndrome)     .  Cancer  2008       thyroid       Past Surgical History   Procedure  Laterality  Date   .  Total thyroidectomy    2008   .  Vocal cord implant  Right  2009   .  Shoulder surgery  Left     .  Colonoscopy           12 yrs ago-normal   .  Dilation and curettage of uterus         ALLERGIES: Adhesive.     Current Outpatient Prescriptions  Medication  Sig  Dispense  Refill   .  B Complex-C-E (VI-STRESS) TABS  Take 1 tablet by mouth daily.         .  Cholecalciferol (VITAMIN D PO)  Take 2,000 Units by mouth daily.         Marland Kitchen  estradiol (CLIMARA - DOSED IN MG/24 HR) 0.05 mg/24hr  Place 1 patch (0.05 mg total) onto the skin once a week.   4 patch   10   .  fexofenadine (ALLEGRA) 180 MG tablet  Take 180 mg by mouth daily.         Marland Kitchen  levothyroxine (SYNTHROID, LEVOTHROID) 125 MCG tablet  Take 125 mcg by mouth daily before breakfast.         .  Lysine 1000 MG TABS  Take 1 tablet by mouth daily.         .  Misc Natural Products (TRIGOSAMINE FAST-ACTING PO)  Take by mouth daily.         .  Multiple Vitamin (MULTIVITAMIN WITH MINERALS) TABS tablet  Take 1 tablet by mouth daily.         .  pimecrolimus (ELIDEL) 1 % cream  Apply 1 application topically 2 (two) times daily.         .  polycarbophil (FIBERCON) 625 MG tablet  Take 625 mg by mouth daily.         .  Probiotic Product (PROBIOTIC DAILY PO)  Take 1 capsule by mouth daily.         .  progesterone (PROMETRIUM) 100 MG capsule  TAKE ONE CAPSULE BY MOUTH DAILY   30 capsule   10   .  UNABLE TO FIND  Take 1 capsule by mouth daily. Neuro-PS gold            No current facility-administered medications for this visit.      ROS:  Pertinent items are noted in HPI.  SOCIAL HISTORY:  Married.   PHYSICAL EXAMINATION:    BP 124/68  Wt 160 lb (72.576 kg)    Wt Readings from Last 3 Encounters:   06/28/13  160 lb  (72.576 kg)   06/28/13  158 lb (71.668 kg)   06/20/13  159 lb (72.122 kg)       Ht Readings from Last 3 Encounters:   06/28/13  5\' 4"  (1.626 m)   09/21/12  5\' 4"  (1.626 m)   09/07/12  5\' 4"  (1.626 m)     General appearance: alert, cooperative and appears stated age Head: Normocephalic, without obvious abnormality, atraumatic Neck: no adenopathy, supple, symmetrical, trachea midline and thyroid not enlarged, symmetric, no tenderness/mass/nodules Lungs: clear to auscultation bilaterally Breasts: Inspection negative, No nipple retraction or dimpling, No nipple discharge or bleeding, No axillary or supraclavicular adenopathy, Normal to palpation without dominant masses Heart: regular rate and rhythm Abdomen: soft, non-tender; no masses,  no organomegaly Extremities: extremities normal, atraumatic, no cyanosis or edema Skin: Skin color, texture, turgor normal. No rashes or lesions Lymph nodes: Cervical, supraclavicular, and axillary nodes normal. No abnormal inguinal nodes palpated Neurologic: Grossly normal  Pelvic: External genitalia:  no lesions              Urethra:  normal appearing urethra with no masses, tenderness or lesions              Bartholins and Skenes: normal                  Vagina: normal appearing vagina  with normal color and discharge, no lesions              Cervix: normal appearance          Bimanual Exam:  Uterus:  uterus is normal size, shape, consistency and nontender                                      Adnexa: normal adnexa in size, nontender and no masses                                      Rectovaginal: Confirms                                      Anus:  normal sphincter tone, no lesions  CBC today - Hgb 16.1, WBC 8.9, platelets 322,000.  ASSESSMENT  Postmenopausal bleeding. Endometrial polyp. Possible bacterial vaginosis.   Elevated Hemoglobin.   PLAN  Proceed with hysteroscopic polypectomy with dilation and curettage on 2/17 15 at [facility].  Risks and benefits have been discussed.   Will treat with Metrogel 0.75% pv at hs from now until surgery.    Medications per Epic orders. Patient will follow up with PCP regarding elevated hemoglobin.   Return       An After Visit Summary was printed and given to the patient.

## 2013-07-03 ENCOUNTER — Encounter (HOSPITAL_COMMUNITY)
Admission: RE | Disposition: A | Discharge status: Home / Self Care | Payer: Self-pay | Source: Ambulatory Visit | Attending: Obstetrics and Gynecology

## 2013-07-03 ENCOUNTER — Ambulatory Visit (HOSPITAL_COMMUNITY): Payer: BC Managed Care – PPO | Admitting: Anesthesiology

## 2013-07-03 ENCOUNTER — Ambulatory Visit (HOSPITAL_COMMUNITY)
Admission: RE | Admit: 2013-07-03 | Discharge: 2013-07-03 | Disposition: A | Discharge status: Home / Self Care | Payer: BC Managed Care – PPO | Source: Ambulatory Visit | Attending: Obstetrics and Gynecology | Admitting: Obstetrics and Gynecology

## 2013-07-03 ENCOUNTER — Encounter (HOSPITAL_COMMUNITY): Payer: BC Managed Care – PPO | Admitting: Anesthesiology

## 2013-07-03 ENCOUNTER — Encounter (HOSPITAL_COMMUNITY): Payer: Self-pay | Admitting: Anesthesiology

## 2013-07-03 DIAGNOSIS — N950 Postmenopausal bleeding: Secondary | ICD-10-CM

## 2013-07-03 DIAGNOSIS — B9689 Other specified bacterial agents as the cause of diseases classified elsewhere: Secondary | ICD-10-CM | POA: Insufficient documentation

## 2013-07-03 DIAGNOSIS — D251 Intramural leiomyoma of uterus: Secondary | ICD-10-CM | POA: Insufficient documentation

## 2013-07-03 DIAGNOSIS — D582 Other hemoglobinopathies: Secondary | ICD-10-CM | POA: Insufficient documentation

## 2013-07-03 DIAGNOSIS — N840 Polyp of corpus uteri: Secondary | ICD-10-CM

## 2013-07-03 DIAGNOSIS — N760 Acute vaginitis: Secondary | ICD-10-CM | POA: Insufficient documentation

## 2013-07-03 DIAGNOSIS — A499 Bacterial infection, unspecified: Secondary | ICD-10-CM | POA: Insufficient documentation

## 2013-07-03 HISTORY — PX: DILATATION & CURRETTAGE/HYSTEROSCOPY WITH RESECTOCOPE: SHX5572

## 2013-07-03 SURGERY — DILATATION & CURETTAGE/HYSTEROSCOPY WITH RESECTOCOPE
Anesthesia: Spinal | Site: Uterus

## 2013-07-03 MED ORDER — METOCLOPRAMIDE HCL 5 MG/ML IJ SOLN: 10.0000 mg | Freq: Once | INTRAMUSCULAR | Status: DC | PRN

## 2013-07-03 MED ORDER — KETOROLAC TROMETHAMINE 30 MG/ML IJ SOLN
INTRAMUSCULAR | Status: DC | PRN
  Administered 2013-07-03: 30 mg via INTRAVENOUS

## 2013-07-03 MED ORDER — KETOROLAC TROMETHAMINE 30 MG/ML IJ SOLN
INTRAMUSCULAR | Status: AC
  Filled 2013-07-03: qty 1

## 2013-07-03 MED ORDER — LIDOCAINE IN DEXTROSE 5-7.5 % IV SOLN
INTRAVENOUS | Status: DC | PRN
  Administered 2013-07-03: 30 mg via INTRATHECAL

## 2013-07-03 MED ORDER — IBUPROFEN 800 MG PO TABS: 800.0000 mg | ORAL_TABLET | Freq: Three times a day (TID) | ORAL | Status: DC | PRN

## 2013-07-03 MED ORDER — PROPOFOL 10 MG/ML IV BOLUS
INTRAVENOUS | Status: DC | PRN
  Administered 2013-07-03 (×2): 10 mg via INTRAVENOUS
  Administered 2013-07-03: 20 mg via INTRAVENOUS
  Administered 2013-07-03 (×4): 10 mg via INTRAVENOUS
  Administered 2013-07-03: 20 mg via INTRAVENOUS
  Administered 2013-07-03 (×2): 10 mg via INTRAVENOUS
  Administered 2013-07-03: 20 mg via INTRAVENOUS
  Administered 2013-07-03: 10 mg via INTRAVENOUS
  Administered 2013-07-03: 5 mg via INTRAVENOUS

## 2013-07-03 MED ORDER — MIDAZOLAM HCL 5 MG/5ML IJ SOLN
INTRAMUSCULAR | Status: DC | PRN
  Administered 2013-07-03 (×2): 1 mg via INTRAVENOUS

## 2013-07-03 MED ORDER — LIDOCAINE HCL 1 % IJ SOLN
INTRAMUSCULAR | Status: DC | PRN
  Administered 2013-07-03: 10 mL

## 2013-07-03 MED ORDER — FENTANYL CITRATE 0.05 MG/ML IJ SOLN
INTRAMUSCULAR | Status: DC | PRN
  Administered 2013-07-03: 50 ug via INTRAVENOUS

## 2013-07-03 MED ORDER — MEPERIDINE HCL 25 MG/ML IJ SOLN: 6.2500 mg | INTRAMUSCULAR | Status: DC | PRN

## 2013-07-03 MED ORDER — LIDOCAINE HCL (CARDIAC) 20 MG/ML IV SOLN
INTRAVENOUS | Status: DC | PRN
  Administered 2013-07-03: 30 mg via INTRAVENOUS
  Administered 2013-07-03: 40 mg via INTRAVENOUS

## 2013-07-03 MED ORDER — LACTATED RINGERS IV SOLN
INTRAVENOUS | Status: DC
  Administered 2013-07-03 (×2): via INTRAVENOUS

## 2013-07-03 MED ORDER — LIDOCAINE HCL 1 % IJ SOLN
INTRAMUSCULAR | Status: AC
  Filled 2013-07-03: qty 20

## 2013-07-03 MED ORDER — FENTANYL CITRATE 0.05 MG/ML IJ SOLN
INTRAMUSCULAR | Status: AC
  Filled 2013-07-03: qty 2

## 2013-07-03 MED ORDER — BUPIVACAINE IN DEXTROSE 0.75-8.25 % IT SOLN
INTRATHECAL | Status: DC | PRN
  Administered 2013-07-03: 9 mg via INTRATHECAL

## 2013-07-03 MED ORDER — MIDAZOLAM HCL 2 MG/2ML IJ SOLN
INTRAMUSCULAR | Status: AC
  Filled 2013-07-03: qty 2

## 2013-07-03 MED ORDER — ONDANSETRON HCL 4 MG/2ML IJ SOLN
INTRAMUSCULAR | Status: DC | PRN
  Administered 2013-07-03: 4 mg via INTRAVENOUS

## 2013-07-03 MED ORDER — LIDOCAINE HCL (CARDIAC) 20 MG/ML IV SOLN
INTRAVENOUS | Status: AC
  Filled 2013-07-03: qty 5

## 2013-07-03 MED ORDER — GLYCINE 1.5 % IR SOLN
Status: DC | PRN
  Administered 2013-07-03: 3000 mL

## 2013-07-03 MED ORDER — FENTANYL CITRATE 0.05 MG/ML IJ SOLN
25.0000 ug | INTRAMUSCULAR | Status: DC | PRN
  Administered 2013-07-03 (×2): 50 ug via INTRAVENOUS

## 2013-07-03 MED ORDER — ONDANSETRON HCL 4 MG/2ML IJ SOLN
INTRAMUSCULAR | Status: AC
  Filled 2013-07-03: qty 2

## 2013-07-03 MED ORDER — PROPOFOL 10 MG/ML IV EMUL
INTRAVENOUS | Status: AC
  Filled 2013-07-03: qty 20

## 2013-07-03 SURGICAL SUPPLY — 24 items
CANISTER SUCT 3000ML (MISCELLANEOUS) ×2 IMPLANT
CATH ROBINSON RED A/P 16FR (CATHETERS) ×2 IMPLANT
CLOTH BEACON ORANGE TIMEOUT ST (SAFETY) ×2 IMPLANT
CONTAINER PREFILL 10% NBF 60ML (FORM) ×4 IMPLANT
DRAPE HYSTEROSCOPY (DRAPE) ×2 IMPLANT
DRSG TELFA 3X8 NADH (GAUZE/BANDAGES/DRESSINGS) ×2 IMPLANT
ELECT REM PT RETURN 9FT ADLT (ELECTROSURGICAL) ×2
ELECTRODE REM PT RTRN 9FT ADLT (ELECTROSURGICAL) ×1 IMPLANT
GLOVE BIO SURGEON STRL SZ 6.5 (GLOVE) ×2 IMPLANT
GLOVE BIOGEL PI IND STRL 7.0 (GLOVE) ×3 IMPLANT
GLOVE BIOGEL PI INDICATOR 7.0 (GLOVE) ×3
GLOVE ECLIPSE 7.0 STRL STRAW (GLOVE) ×4 IMPLANT
GLOVE SURG SS PI 7.0 STRL IVOR (GLOVE) ×8 IMPLANT
GOWN STRL REUS W/TWL LRG LVL3 (GOWN DISPOSABLE) ×4 IMPLANT
LOOP ANGLED CUTTING 22FR (CUTTING LOOP) ×2 IMPLANT
NEEDLE SPNL 20GX3.5 QUINCKE YW (NEEDLE) ×2 IMPLANT
NEEDLE SPNL 22GX3.5 QUINCKE BK (NEEDLE) ×2 IMPLANT
PACK VAGINAL MINOR WOMEN LF (CUSTOM PROCEDURE TRAY) ×2 IMPLANT
PAD OB MATERNITY 4.3X12.25 (PERSONAL CARE ITEMS) ×2 IMPLANT
SET TUBING HYSTEROSCOPY 2 NDL (TUBING) ×2 IMPLANT
SYR CONTROL 10ML LL (SYRINGE) ×2 IMPLANT
TOWEL OR 17X24 6PK STRL BLUE (TOWEL DISPOSABLE) ×4 IMPLANT
TUBE HYSTEROSCOPY W Y-CONNECT (TUBING) ×2 IMPLANT
WATER STERILE IRR 1000ML POUR (IV SOLUTION) ×2 IMPLANT

## 2013-07-03 NOTE — Anesthesia Procedure Notes (Signed)
Spinal  Patient location during procedure: OR Start time: 07/03/2013 12:12 PM Staffing Anesthesiologist: Nikisha Fleece A. Performed by: anesthesiologist  Preanesthetic Checklist Completed: patient identified, site marked, surgical consent, pre-op evaluation, timeout performed, IV checked, risks and benefits discussed and monitors and equipment checked Spinal Block Patient position: sitting Prep: site prepped and draped and DuraPrep Patient monitoring: heart rate, cardiac monitor, continuous pulse ox and blood pressure Approach: midline Location: L3-4 Injection technique: single-shot Needle Needle type: Sprotte  Needle gauge: 24 G Needle length: 9 cm Assessment Sensory level: T8

## 2013-07-03 NOTE — Brief Op Note (Signed)
07/03/2013  1:06 PM  PATIENT:  Kelly Ingram  63 y.o. female  PRE-OPERATIVE DIAGNOSIS:  Post Menopausal Bleeding, endometrial polyp  POST-OPERATIVE DIAGNOSIS:  Post Menopausal Bleeding, endometrial polyp  PROCEDURE:  Procedure(s): DILATATION & CURETTAGE/HYSTEROSCOPY WITH RESECTOCOPE (N/A)  SURGEON:  Surgeon(s) and Role:    * Brook E Amundson de Berton Lan, MD - Primary  PHYSICIAN ASSISTANT:   ASSISTANTS: none   ANESTHESIA:   spinal, IV sedation and paracervical block  EBL:  Total I/O In: 1300 [I.V.:1300] Out: 300 [Urine:300]  FLUID DEFICIT:  150 cc Glycine  BLOOD ADMINISTERED:none  DRAINS: none   LOCAL MEDICATIONS USED:  LIDOCAINE  and Amount:  10 ml  SPECIMEN:  Source of Specimen:   endometrial polyp, endometrial curettings  DISPOSITION OF SPECIMEN:  PATHOLOGY  COUNTS:  YES  TOURNIQUET:  * No tourniquets in log *  DICTATION: .Other Dictation: Dictation Number    PLAN OF CARE: Discharge to home after PACU  PATIENT DISPOSITION:  PACU - hemodynamically stable.   Delay start of Pharmacological VTE agent (>24hrs) due to surgical blood loss or risk of bleeding: not applicable

## 2013-07-03 NOTE — Preoperative (Signed)
Beta Blockers   Reason not to administer Beta Blockers:Not Applicable 

## 2013-07-03 NOTE — Transfer of Care (Signed)
Immediate Anesthesia Transfer of Care Note  Patient: Kelly Ingram  Procedure(s) Performed: Procedure(s): DILATATION & CURETTAGE/HYSTEROSCOPY WITH RESECTOCOPE (N/A)  Patient Location: PACU  Anesthesia Type:Spinal  Level of Consciousness: awake, alert , oriented and patient cooperative  Airway & Oxygen Therapy: Patient Spontanous Breathing and Patient connected to nasal cannula oxygen  Post-op Assessment: Report given to PACU RN and Post -op Vital signs reviewed and stable  Post vital signs: Reviewed and stable  Complications: No apparent anesthesia complications

## 2013-07-03 NOTE — Anesthesia Postprocedure Evaluation (Signed)
  Anesthesia Post-op Note  Patient: Kelly Ingram  Procedure(s) Performed: Procedure(s): DILATATION & CURETTAGE/HYSTEROSCOPY WITH RESECTOCOPE (N/A)  Patient Location: PACU  Anesthesia Type:Spinal  Level of Consciousness: awake, alert  and oriented  Airway and Oxygen Therapy: Patient Spontanous Breathing  Post-op Pain: none  Post-op Assessment: Post-op Vital signs reviewed  Post-op Vital Signs: Reviewed and stable  Complications: No apparent anesthesia complications

## 2013-07-03 NOTE — Progress Notes (Signed)
Update to History and Physical  No marked change in status since office visit. Used Metrogel for 5 days prior to surgery for suspected bacterial vaginosis.   OK to proceed with surgery.

## 2013-07-03 NOTE — Discharge Instructions (Addendum)
Hysteroscopy, Care After Refer to this sheet in the next few weeks. These instructions provide you with information on caring for yourself after your procedure. Your health care provider may also give you more specific instructions. Your treatment has been planned according to current medical practices, but problems sometimes occur. Call your health care provider if you have any problems or questions after your procedure.  WHAT TO EXPECT AFTER THE PROCEDURE After your procedure, it is typical to have the following:  You may have some cramping. This normally lasts for a couple days.  You may have bleeding. This can vary from light spotting for a few days to menstrual-like bleeding for 3 7 days. HOME CARE INSTRUCTIONS  Rest for the first 1 2 days after the procedure.  Only take over-the-counter or prescription medicines as directed by your health care provider. Do not take aspirin. It can increase the chances of bleeding.  Take showers instead of baths for 2 weeks or as directed by your health care provider.  Do not drive for 24 hours or as directed.  Do not drink alcohol while taking pain medicine.  Do not use tampons, douche, or have sexual intercourse for 2 weeks or until your health care provider says it is okay.  Take your temperature twice a day for 4 5 days. Write it down each time.  Follow your health care provider's advice about diet, exercise, and lifting.  If you develop constipation, you may:  Take a mild laxative if your health care provider approves.  Add bran foods to your diet.  Drink enough fluids to keep your urine clear or pale yellow.  Try to have someone with you or available to you for the first 24 48 hours, especially if you were given a general anesthetic.  Follow up with your health care provider as directed. SEEK MEDICAL CARE IF:  You feel dizzy or lightheaded.  You feel sick to your stomach (nauseous).  You have abnormal vaginal discharge.  You  have a rash.  You have pain that is not controlled with medicine. SEEK IMMEDIATE MEDICAL CARE IF:  You have bleeding that is heavier than a normal menstrual period.  You have a fever.  You have increasing cramps or pain, not controlled with medicine.  You have new belly (abdominal) pain.  You pass out.  You have pain in the tops of your shoulders (shoulder strap areas).  You have shortness of breath. Document Released: 02/21/2013 Document Reviewed: 11/30/2012 [facility] Patient Information 2014 [place], [place]. DISCHARGE INSTRUCTIONS: D&C / D&E The following instructions have been prepared to help you care for yourself upon your return home.   Personal hygiene:  Use sanitary pads for vaginal drainage, not tampons.  Shower the day after your procedure.  NO tub baths, pools or Jacuzzis for 2-3 weeks.  Wipe front to back after using the bathroom.  Activity and limitations:  Do NOT drive or operate any equipment for 24 hours. The effects of anesthesia are still present and drowsiness may result.  Do NOT rest in bed all day.  Walking is encouraged.  Walk up and down stairs slowly.  You may resume your normal activity in one to two days or as indicated by your physician.  Sexual activity: NO intercourse for at least 2 weeks after the procedure, or as indicated by your physician.  Diet: Eat a light meal as desired this evening. You may resume your usual diet tomorrow.  Return to work: You may resume your work activities in  one to two days or as indicated by your doctor.  What to expect after your surgery: Expect to have vaginal bleeding/discharge for 2-3 days and spotting for up to 10 days. It is not unusual to have soreness for up to 1-2 weeks. You may have a slight burning sensation when you urinate for the first day. Mild cramps may continue for a couple of days. You may have a regular period in 2-6 weeks.  Call your doctor for any of the following:  Excessive vaginal  bleeding, saturating and changing one pad every hour.  Inability to urinate 6 hours after discharge from hospital.  Pain not relieved by pain medication.  Fever of 100.4 F or greater.  Unusual vaginal discharge or odor.   Call for an appointment:    Patients signature: ______________________  Nurses signature ________________________  Support person's signature_______________________

## 2013-07-04 NOTE — Op Note (Signed)
NAME:  Kelly Ingram, Kelly Ingram NO.:  [id number]  MEDICAL RECORD NO.:  [id number]  LOCATION:  [facility]                          FACILITY:  [facility]  PHYSICIAN:  Lenard Galloway, M.D.   DATE OF BIRTH:  07-Jan-1951  DATE OF PROCEDURE: DATE OF DISCHARGE:  07/03/2013                              OPERATIVE REPORT   PREOPERATIVE DIAGNOSES:  Postmenopausal bleeding, endometrial polyp.  POSTOPERATIVE DIAGNOSES:  Postmenopausal bleeding, endometrial polyp.  PROCEDURE:  Hysteroscopic polypectomy with dilation and curettage.  SURGEON:  Lenard Galloway, M.D.  ANESTHESIA:  Spinal, IV sedation, paracervical block with 1% lidocaine.  IV FLUIDS:  1300 mL Ringer's lactate.  EBL:  Minimal.  URINE OUTPUT:  300 mL.  GLYCINE DEFICIT:  150 mL.  COMPLICATIONS:  None.  INDICATIONS FOR THE PROCEDURE:  The patient is a 63 year old, gravida 1, para 0-0-1-0 Caucasian female, who presented with postmenopausal bleeding on hormone therapy.  The patient was on Climara 0.05 mg and daily Prometrium 100 mg.  Pelvic ultrasound in the office documented a 1.6 cm intramural fibroid and endometrium measuring 7.3 mm with a feeder vessel appreciated.  Sonohysterogram confirmed a filling defect measuring 13 x 12 mm, and an endometrial biopsy confirmed a benign endometrial polyp.  The patient now presents for hysteroscopic polypectomy with dilation and curettage after risks, benefits, and alternatives are reviewed.  FINDINGS:  Exam under anesthesia revealed a small anteverted mobile uterus.  No adnexal masses are appreciated.  The uterus sounded to 7 cm.  Hysteroscopy demonstrated a broad-based polyp adherent to the anterior fundus.  The tubal ostial regions were visualized well.  There was no evidence of any intracavitary fibroids.  DESCRIPTION OF PROCEDURE:  The patient was reidentified in the preoperative hold area.  She received PAS for DVT prophylaxis.  In the operating room, the patient received a  spinal anesthetic and was then placed in the dorsal lithotomy position.  She received IV sedation. The lower abdomen, vagina, and perineum were then sterilely prepped, and the patient was catheterized of urine.  She was sterilely draped.  An examination under anesthesia was performed.  A speculum was placed inside the vagina and a single-tooth tenaculum was placed on the anterior cervical lip.  The paracervical block was performed with a total of 10 mL of 1% lidocaine.  The uterus was sounded to 7 cm.  The cervix was then dilated to a #21 Pratt dilator and the diagnostic hysteroscope was inserted into the uterine cavity under the continuous infusion of glycine solution.  The findings are as noted above.  The hysteroscope was removed and the cervix was then further dilated to a #27 Pratt dilator.  The resectoscope was then inserted into the uterine cavity under continuous infusion of glycine.  Monopolar cautery was used to resect the endometrial polyp, which was set aside.  The hysteroscope was then removed and sharp curettage with both the smooth and serrated curettes was performed in all 4 quadrants such that the endometrium had a gritty texture to it.  The resectoscope was introduced one final time for visualization of the endometrial cavity.  A small additional portion of the polyp was resected from the anterior fundal wall.  This was sent to pathology  along with the other endometrial polyp specimens.  The resectoscope was removed at this time.  There was some slight oozing from the anterior cervical lip and this responded to compression with a ring forceps.  Hemostasis was excellent and the procedure was concluded.  All vaginal instruments were removed.  The patient was cleansed with Betadine.  She was awakened and escorted to the recovery room in stable condition. There were no complications to the procedure.  All needle, instrument, and sponge counts were correct.     Lenard Galloway, M.D.     BES/MEDQ  D:  07/03/2013  T:  07/03/2013  Job:  [id number]

## 2013-07-05 ENCOUNTER — Encounter (HOSPITAL_COMMUNITY): Payer: Self-pay | Admitting: Obstetrics and Gynecology

## 2013-07-06 ENCOUNTER — Telehealth: Payer: Self-pay | Admitting: Obstetrics and Gynecology

## 2013-07-06 NOTE — Telephone Encounter (Signed)
Phone call to give results of pathology report which is benign.  I left a message for the patient that I was calling with all good news and good reports.  No specifics left for th patient on the message machine.

## 2013-07-16 ENCOUNTER — Other Ambulatory Visit: Payer: Self-pay | Admitting: Obstetrics and Gynecology

## 2013-07-16 NOTE — Telephone Encounter (Signed)
Last AEX 08/28/2012 Last refill 10/05/12 Next appt 07/18/2013  Will refill once until appt.

## 2013-07-18 ENCOUNTER — Encounter: Payer: Self-pay | Admitting: Obstetrics and Gynecology

## 2013-07-18 ENCOUNTER — Ambulatory Visit (INDEPENDENT_AMBULATORY_CARE_PROVIDER_SITE_OTHER): Payer: BC Managed Care – PPO | Admitting: Obstetrics and Gynecology

## 2013-07-18 VITALS — BP 118/78 | HR 70 | Ht 64.0 in | Wt 159.0 lb

## 2013-07-18 DIAGNOSIS — Z9889 Other specified postprocedural states: Secondary | ICD-10-CM

## 2013-07-18 MED ORDER — ESTRADIOL 0.05 MG/24HR TD PTWK: MEDICATED_PATCH | TRANSDERMAL | Status: DC

## 2013-07-18 NOTE — Patient Instructions (Addendum)
Paroxetine capsules What is this medicine? PAROXETINE (pa ROX e teen) is used to treat hot flashes due to menopause. This medicine may be used for other purposes; ask your health care provider or pharmacist if you have questions. COMMON BRAND NAME(S): Brisdelle What should I tell my health care provider before I take this medicine? They need to know if you have any of these conditions: -bleeding disorders -glaucoma -heart disease -kidney disease -liver disease -low levels of sodium in the blood -mania or bipolar disorder -seizures -suicidal thoughts, plans, or attempt; a previous suicide attempt by you or a family member -take MAOIs like Carbex, Eldepryl, Marplan, Nardil, and Parnate -take medicines that treat or prevent blood clots -an unusual or allergic reaction to paroxetine, other medicines, foods, dyes, or preservatives -pregnant or trying to get pregnant -breast-feeding How should I use this medicine? Take this medicine by mouth once daily at bedtime. Follow the directions on the prescription label. This medicine can be taken with or without food. Take your medicine at regular intervals. Do not take your medicine more often than directed. A special MedGuide will be given to you by the pharmacist with each prescription and refill. Be sure to read this information carefully each time. Overdosage: If you think you've taken too much of this medicine contact a poison control center or emergency room at once. Overdosage: If you think you have taken too much of this medicine contact a poison control center or emergency room at once. NOTE: This medicine is only for you. Do not share this medicine with others. What if I miss a dose? If you miss a dose, take it as soon as you can. If it is almost time for your next dose, take only that dose. Do not take double or extra doses. What may interact with this medicine? Do not take this medicine with any of the following  medications: -linezolid -MAOIs like Carbex, Eldepryl, Marplan, Nardil, and Parnate -methylene blue (injected into a vein) -pimozide -thioridazine This medicine may also interact with the following medications: -alcohol -aspirin and aspirin-like medicines -atomoxetine -certain medicines for depression, anxiety, or psychotic disturbances -certain medicines for irregular heart beat like propafenone, flecainide, encainide, and quinidine -certain medicines for migraine headache like almotriptan, eletriptan, frovatriptan, naratriptan, rizatriptan, sumatriptan, zolmitriptan -cimetidine -digoxin -diuretics -fentanyl -fosamprenavir -furazolidone -isoniazid -lithium -medicines that treat or prevent blood clots like warfarin, enoxaparin, and dalteparin -medicines for sleep -NSAIDs, medicines for pain and inflammation, like ibuprofen or naproxen -phenobarbital -phenytoin -procarbazine -rasagiline -ritonavir -supplements like St. John's wort, kava kava, valerian -tamoxifen -tramadol -tryptophan This list may not describe all possible interactions. Give your health care provider a list of all the medicines, herbs, non-prescription drugs, or dietary supplements you use. Also tell them if you smoke, drink alcohol, or use illegal drugs. Some items may interact with your medicine. What should I watch for while using this medicine? Tell your doctor or healthcare professional if your symptoms do not start to get better or if they get worse. Visit your doctor or health care professional for regular checks on your progress. Patients and their families should watch out for new or worsening thoughts of suicide or depression. Also watch out for sudden changes in feelings such as feeling anxious, agitated, panicky, irritable, hostile, aggressive, impulsive, severely restless, overly excited and hyperactive, or not being able to sleep. If this happens, especially at the beginning of treatment or after a  change in dose, call your health care professional. You may get drowsy or dizzy. Do   not drive, use machinery, or do anything that needs mental alertness until you know how this medicine affects you. Do not stand or sit up quickly, especially if you are an older patient. This reduces the risk of dizzy or fainting spells. Alcohol may interfere with the effect of this medicine. Avoid alcoholic drinks. Your mouth may get dry. Chewing sugarless gum, sucking hard candy and drinking plenty of water will help. Contact your doctor if the problem does not go away or is severe. Women should inform their doctor if they wish to become pregnant or think they might be pregnant. There is a potential for serious side effects to an unborn child. Talk to your health care professional or pharmacist for more information. Do not become pregnant while taking this medicine. What side effects may I notice from receiving this medicine? Side effects that you should report to your doctor or health care professional as soon as possible: -allergic reactions like skin rash, itching or hives, swelling of the face, lips, or tongue -changes in emotions or moods -confusion -depression -feeling faint or lightheaded, falls -seizures -suicidal thoughts or actions -unusual bleeding or bruising -unusually weak or tired -weakness Side effects that usually do not require medical attention (Report these to your doctor or health care professional if they continue or are bothersome.): -change in sex drive or performance -fatigue -drowsiness -headache -insomnia -nausea/vomiting -upset stomach This list may not describe all possible side effects. Call your doctor for medical advice about side effects. You may report side effects to FDA at 1-800-FDA-1088. Where should I keep my medicine? Keep out of the reach of children. Store at room temperature between 20 and 25 degrees C (68 and 77 degrees F). Throw away any unused medicine after  the expiration date. NOTE: This sheet is a summary. It may not cover all possible information. If you have questions about this medicine, talk to your doctor, pharmacist, or health care provider.  2014, [employer]vier/[employer] Standard. (2012-01-03 12:26:17) Venlafaxine extended-release capsules What is this medicine? VENLAFAXINE(VEN la fax een) is used to treat depression, anxiety and panic disorder. This medicine may be used for other purposes; ask your health care provider or pharmacist if you have questions. COMMON BRAND NAME(S): Effexor XR What should I tell my health care provider before I take this medicine? They need to know if you have any of these conditions: -bleeding disorders -glaucoma -heart disease -high blood pressure -high cholesterol -kidney disease -liver disease -low levels of sodium in the blood -mania or bipolar disorder -seizures -suicidal thoughts, plans, or attempt; a previous suicide attempt by you or a family -take medicines that treat or prevent blood clots -thyroid disease -an unusual or allergic reaction to venlafaxine, desvenlafaxine, other medicines, foods, dyes, or preservatives -pregnant or trying to get pregnant -breast-feeding How should I use this medicine? Take this medicine by mouth with a full glass of water. Follow the directions on the prescription label. Do not cut, crush, or chew this medicine. Take it with food. If needed, the capsule may be carefully opened and the entire contents sprinkled on a spoonful of cool applesauce. Swallow the applesauce/pellet mixture right away without chewing and follow with a glass of water to ensure complete swallowing of the pellets. Try to take your medicine at about the same time each day. Do not take your medicine more often than directed. Do not stop taking this medicine suddenly except upon the advice of your doctor. Stopping this medicine too quickly may cause serious side effects or  your condition may worsen. A  special MedGuide will be given to you by the pharmacist with each prescription and refill. Be sure to read this information carefully each time. Talk to your pediatrician regarding the use of this medicine in children. Special care may be needed. Overdosage: If you think you have taken too much of this medicine contact a poison control center or emergency room at once. NOTE: This medicine is only for you. Do not share this medicine with others. What if I miss a dose? If you miss a dose, take it as soon as you can. If it is almost time for your next dose, take only that dose. Do not take double or extra doses. What may interact with this medicine? Do not take this medicine with any of the following medications: -certain medicines for fungal infections like fluconazole, itraconazole, ketoconazole, posaconazole, voriconazole -cisapride -desvenlafaxine -dofetilide -dronedarone -duloxetine -levomilnacipran -linezolid -MAOIs like Carbex, Eldepryl, Marplan, Nardil, and Parnate -methylene blue (injected into a vein) -milnacipran -pimozide -thioridazine -ziprasidone  This medicine may also interact with the following medications: -aspirin and aspirin-like medicines -certain medicines for depression, anxiety, or psychotic disturbances -certain medicines for migraine headaches like almotriptan, eletriptan, frovatriptan, naratriptan, rizatriptan, sumatriptan, zolmitriptan -certain medicines for sleep -certain medicines that treat or prevent blood clots like dalteparin, enoxaparin, warfarin -cimetidine -clozapine -diuretics -fentanyl -furazolidone -indinavir -isoniazid -lithium -metoprolol -NSAIDS, medicines for pain and inflammation, like ibuprofen or naproxen -other medicines that prolong the QT interval (cause an abnormal heart rhythm) -procarbazine -rasagiline -supplements like St. John's wort, kava kava, valerian -tramadol -tryptophan This list may not describe all possible  interactions. Give your health care provider a list of all the medicines, herbs, non-prescription drugs, or dietary supplements you use. Also tell them if you smoke, drink alcohol, or use illegal drugs. Some items may interact with your medicine. What should I watch for while using this medicine? Tell your doctor if your symptoms do not get better or if they get worse. Visit your doctor or health care professional for regular checks on your progress. Because it may take several weeks to see the full effects of this medicine, it is important to continue your treatment as prescribed by your doctor. Patients and their families should watch out for new or worsening thoughts of suicide or depression. Also watch out for sudden changes in feelings such as feeling anxious, agitated, panicky, irritable, hostile, aggressive, impulsive, severely restless, overly excited and hyperactive, or not being able to sleep. If this happens, especially at the beginning of treatment or after a change in dose, call your health care professional. This medicine can cause an increase in blood pressure. Check with your doctor for instructions on monitoring your blood pressure while taking this medicine. You may get drowsy or dizzy. Do not drive, use machinery, or do anything that needs mental alertness until you know how this medicine affects you. Do not stand or sit up quickly, especially if you are an older patient. This reduces the risk of dizzy or fainting spells. Alcohol may interfere with the effect of this medicine. Avoid alcoholic drinks. Your mouth may get dry. Chewing sugarless gum, sucking hard candy and drinking plenty of water will help. Contact your doctor if the problem does not go away or is severe. What side effects may I notice from receiving this medicine? Side effects that you should report to your doctor or health care professional as soon as possible: -allergic reactions like skin rash, itching or hives, swelling  of the face,  lips, or tongue -breathing problems -changes in vision -hallucination, loss of contact with reality -seizures -suicidal thoughts or other mood changes -trouble passing urine or change in the amount of urine -unusual bleeding or bruising Side effects that usually do not require medical attention (report to your doctor or health care professional if they continue or are bothersome): -change in sex drive or performance -constipation -increased sweating -loss of appetite -nausea -tremors -weight loss This list may not describe all possible side effects. Call your doctor for medical advice about side effects. You may report side effects to FDA at 1-800-FDA-1088. Where should I keep my medicine? Keep out of the reach of children. Store at a controlled temperature between 20 and 25 degrees C (68 degrees and 77 degrees F), in a dry place. Throw away any unused medicine after the expiration date. NOTE: This sheet is a summary. It may not cover all possible information. If you have questions about this medicine, talk to your doctor, pharmacist, or health care provider.  2014, [employer]vier/[employer] Standard. (2012-11-28 12:46:03)

## 2013-07-18 NOTE — Progress Notes (Signed)
Patient ID: Kelly Ingram, female   DOB: 11-Feb-1951, 63 y.o.   MRN: [id number]  Subjective  Patient is her for a post op visit 2 weeks following a hysteroscopic polypectomy with dilation and curettage.  Had a good experience at [facility] for 11 days post op.  Took Ibupropfen 800 mg for the first 1 - 2 days.   On HRT - On Climara 0.5 mg and Prometrium 100 mg.  Wondering if it is time to stop HRT, which was reinitiated due to significant vasomotor instability symptoms.   Objective  Vulva with erythema of labia bilaterally. Cervix - tenaculum marked noted. Uterus small and anteverted, nontender. No adnexal masses or tenderness.  Pathology report - benign endometrial polyp and atrophic endometrium, benign myometrium, and benign endocervix.   Assessment  Doing well post op. HRT patient.  Plan  Discussion with patient and her husband regarding [facility] and potential increased risk of DVT, PE, MI, stroke, and breast cancer while on HRT.  Discussed benefits of treatment of vasomotor symptoms and to improve sense of well being in addition to reducing risk of osteoporosis.  Discussed Medicare's new posture of approving limited quantities of HRT or ERT for their patients. Discussed Brisdelle and Effexor as nonhormonal options for treating vasomotor symptoms.  Written information to patient who will do self study.   I did give an additional one month supply of Climara 0.05 mg weekly patch so does not run out before 4/15 visit.  Has enough Prometrium. Follow up in one month for annual exam.   After visit summary to patient.

## 2013-07-21 ENCOUNTER — Encounter: Payer: Self-pay | Admitting: Obstetrics and Gynecology

## 2013-07-24 ENCOUNTER — Encounter: Payer: Self-pay | Admitting: Obstetrics and Gynecology

## 2013-07-25 ENCOUNTER — Other Ambulatory Visit: Payer: Self-pay | Admitting: Obstetrics and Gynecology

## 2013-07-25 MED ORDER — VENLAFAXINE HCL ER 37.5 MG PO CP24: 37.5000 mg | ORAL_CAPSULE | Freq: Every day | ORAL | Status: DC

## 2013-07-30 ENCOUNTER — Encounter: Payer: Self-pay | Admitting: Obstetrics and Gynecology

## 2013-07-30 ENCOUNTER — Other Ambulatory Visit: Payer: Self-pay | Admitting: Obstetrics and Gynecology

## 2013-07-30 MED ORDER — ESTRADIOL 0.025 MG/24HR TD PTWK: MEDICATED_PATCH | TRANSDERMAL | Status: DC

## 2013-08-01 ENCOUNTER — Telehealth: Payer: Self-pay | Admitting: Obstetrics and Gynecology

## 2013-08-02 NOTE — Telephone Encounter (Signed)
Please disregard message made in error

## 2013-08-29 ENCOUNTER — Ambulatory Visit (INDEPENDENT_AMBULATORY_CARE_PROVIDER_SITE_OTHER): Payer: BC Managed Care – PPO | Admitting: Obstetrics and Gynecology

## 2013-08-29 ENCOUNTER — Encounter: Payer: Self-pay | Admitting: Obstetrics and Gynecology

## 2013-08-29 VITALS — BP 120/78 | HR 70 | Resp 16 | Ht 63.75 in | Wt 157.0 lb

## 2013-08-29 DIAGNOSIS — Z13820 Encounter for screening for osteoporosis: Secondary | ICD-10-CM

## 2013-08-29 DIAGNOSIS — Z01419 Encounter for gynecological examination (general) (routine) without abnormal findings: Secondary | ICD-10-CM

## 2013-08-29 NOTE — Progress Notes (Signed)
GYNECOLOGY VISIT  PCP:   Roque Cash, MD  Referring provider:   HPI: 63 y.o.   Married  Caucasian  female   G1P0010 with Patient's last menstrual period was 05/17/1998.   here for  AEX   Stopped hormone therapy last hs.  Went to a homeopathic doctor.  Taking Estrovera - Rhubarb supplement.   Effexor is not an option for the patient due to her prior reaction in March 2015 - tremors and cognitive change.  No further spotting or bleeding.   Hgb: pcp  - Saw PCP recently for blood work.  Urine: pcp   GYNECOLOGIC HISTORY: Patient's last menstrual period was 05/17/1998. Sexually active:  yes Partner preference: female Contraception:   Husband vasectomy Menopausal hormone therapy: none now DES exposure:   no Blood transfusions:   no Sexually transmitted diseases:   no GYN procedures and prior surgeries:  D & C Last mammogram:   5-15 2014  normal      Last pap and high risk HPV testing:  08-28-12 neg HPV HR neg  History of abnormal pap smear:  none   OB History   Grav Para Term Preterm Abortions TAB SAB Ect Mult Living   1    1     0       LIFESTYLE: Exercise:  none           Tobacco: none Alcohol: 3-4 Drug use:  none  OTHER HEALTH MAINTENANCE: Tetanus/TDap: 2010 Gardisil: none Influenza:  never Zostavax: never  Bone density: had a couple of times in the past - first was marginal and the second time it was better. Colonoscopy: 2014  Cholesterol check: 2015  Family History  Problem Relation Age of Onset  . Thyroid disease Mother   . Hypertension Paternal Grandmother   . Hypertension Paternal Grandfather   . Colon cancer Neg Hx     Patient Active Problem List   Diagnosis Date Noted  . Endometrial polyp 06/20/2013  . Postmenopausal bleeding 06/07/2013   Past Medical History  Diagnosis Date  . Seasonal allergies   . Thyroid disease 2008    hx thyroid cancer--bilateral nodules  . IBS (irritable bowel syndrome)   . Cancer 2008    thyroid  . Endometrial  polyp 2/15    benign    Past Surgical History  Procedure Laterality Date  . Total thyroidectomy  2008  . Vocal cord implant Right 2009  . Shoulder surgery Left   . Colonoscopy      12 yrs ago-normal  . Dilation and curettage of uterus    . Dilatation & currettage/hysteroscopy with resectocope N/A 07/03/2013    Procedure: DILATATION & CURETTAGE/HYSTEROSCOPY WITH RESECTOCOPE;  Surgeon: Jamey Reas de Berton Lan, MD;  Location: [facility];  Service: Gynecology;  Laterality: N/A;    ALLERGIES: Adhesive  Current Outpatient Prescriptions  Medication Sig Dispense Refill  . fexofenadine (ALLEGRA) 180 MG tablet Take 180 mg by mouth daily.      Marland Kitchen ibuprofen (ADVIL,MOTRIN) 800 MG tablet Take 1 tablet (800 mg total) by mouth every 8 (eight) hours as needed.  30 tablet  0  . levothyroxine (SYNTHROID, LEVOTHROID) 125 MCG tablet Take 125 mcg by mouth daily before breakfast.      . polycarbophil (FIBERCON) 625 MG tablet Take 625 mg by mouth daily.      . Probiotic Product (PROBIOTIC DAILY PO) Take 1 capsule by mouth daily.      Marland Kitchen UNABLE TO FIND Estrovera taking once daily  No current facility-administered medications for this visit.     ROS:  Pertinent items are noted in HPI.  SOCIAL HISTORY:  Married.   Will return to [place] for 4 months this year.   PHYSICAL EXAMINATION:    BP 120/78  Pulse 70  Resp 16  Ht 5' 3.75" (1.619 m)  Wt 157 lb (71.215 kg)  BMI 27.17 kg/m2  LMP 05/17/1998   Wt Readings from Last 3 Encounters:  08/29/13 157 lb (71.215 kg)  07/18/13 159 lb (72.122 kg)  06/28/13 160 lb (72.576 kg)     Ht Readings from Last 3 Encounters:  08/29/13 5' 3.75" (1.619 m)  07/18/13 5\' 4"  (1.626 m)  06/28/13 5\' 4"  (1.626 m)    General appearance: alert, cooperative and appears stated age Head: Normocephalic, without obvious abnormality, atraumatic Neck: no adenopathy, supple, symmetrical, trachea midline and thyroid absent, no tenderness/mass/nodules Lungs: clear to  auscultation bilaterally Breasts: Inspection negative, No nipple retraction or dimpling, No nipple discharge or bleeding, No axillary or supraclavicular adenopathy, Normal to palpation without dominant masses Heart: regular rate and rhythm Abdomen: soft, non-tender; no masses,  no organomegaly Extremities: extremities normal, atraumatic, no cyanosis or edema Skin: Skin color, texture, turgor normal. No rashes or lesions. Vitiligo noted. Lymph nodes: Cervical, supraclavicular, and axillary nodes normal. No abnormal inguinal nodes palpated Neurologic: Grossly normal  Pelvic: External genitalia:  no lesions.  Labia with erythema bilaterally.  Hyperpigmentation of the lateral vulva - bilaterally.               Urethra:  normal appearing urethra with no masses, tenderness or lesions              Bartholins and Skenes: normal                 Vagina: normal appearing vagina with normal color and discharge, no lesions              Cervix: normal appearance              Pap and high risk HPV testing done: no.            Bimanual Exam:  Uterus:  uterus is normal size, shape, consistency and nontender                                      Adnexa: normal adnexa in size, nontender and no masses                                      Rectovaginal: Confirms                                      Anus:  normal sphincter tone, no lesions  ASSESSMENT  Normal gynecologic exam. Recent discontinuation of HRT.  Patient taking herbal supplementation with Rhubarb.   PLAN  Mammogram recommended yearly.  I discussed 3 D.  Pap smear and high risk HPV testing not indicated.   Discussed with patient.  Counseled on self breast exam, Calcium and vitamin D intake. Bone density ordered.  Return annually or prn   An After Visit Summary was printed and given to the patient.

## 2013-08-29 NOTE — Patient Instructions (Signed)

## 2013-08-31 ENCOUNTER — Other Ambulatory Visit: Payer: Self-pay | Admitting: Obstetrics and Gynecology

## 2013-08-31 DIAGNOSIS — Z1231 Encounter for screening mammogram for malignant neoplasm of breast: Secondary | ICD-10-CM

## 2013-08-31 DIAGNOSIS — Z780 Asymptomatic menopausal state: Secondary | ICD-10-CM

## 2013-09-19 ENCOUNTER — Ambulatory Visit
Admission: RE | Admit: 2013-09-19 | Discharge: 2013-09-19 | Disposition: A | Discharge status: Home / Self Care | Payer: BC Managed Care – PPO | Source: Ambulatory Visit | Attending: Obstetrics and Gynecology | Admitting: Obstetrics and Gynecology

## 2013-09-19 DIAGNOSIS — Z1231 Encounter for screening mammogram for malignant neoplasm of breast: Secondary | ICD-10-CM

## 2013-09-19 DIAGNOSIS — Z780 Asymptomatic menopausal state: Secondary | ICD-10-CM

## 2013-10-01 ENCOUNTER — Ambulatory Visit: Admission: RE | Admit: 2013-10-01 | Payer: BC Managed Care – PPO | Source: Ambulatory Visit

## 2013-10-01 ENCOUNTER — Ambulatory Visit
Admission: RE | Admit: 2013-10-01 | Discharge: 2013-10-01 | Disposition: A | Discharge status: Home / Self Care | Payer: BC Managed Care – PPO | Source: Ambulatory Visit | Attending: Obstetrics and Gynecology | Admitting: Obstetrics and Gynecology

## 2013-10-01 ENCOUNTER — Other Ambulatory Visit: Payer: Self-pay | Admitting: Obstetrics and Gynecology

## 2013-10-01 DIAGNOSIS — Z1231 Encounter for screening mammogram for malignant neoplasm of breast: Secondary | ICD-10-CM

## 2014-03-18 ENCOUNTER — Encounter: Payer: Self-pay | Admitting: Obstetrics and Gynecology

## 2014-08-30 ENCOUNTER — Ambulatory Visit: Payer: Self-pay | Admitting: Obstetrics and Gynecology

## 2014-09-04 ENCOUNTER — Ambulatory Visit: Payer: BC Managed Care – PPO | Admitting: Obstetrics and Gynecology

## 2014-09-18 ENCOUNTER — Ambulatory Visit (INDEPENDENT_AMBULATORY_CARE_PROVIDER_SITE_OTHER): Payer: Medicare HMO | Admitting: Obstetrics and Gynecology

## 2014-09-18 ENCOUNTER — Encounter: Payer: Self-pay | Admitting: Obstetrics and Gynecology

## 2014-09-18 VITALS — BP 120/78 | HR 66 | Resp 14 | Ht 63.5 in | Wt 149.2 lb

## 2014-09-18 DIAGNOSIS — Z01419 Encounter for gynecological examination (general) (routine) without abnormal findings: Secondary | ICD-10-CM

## 2014-09-18 NOTE — Patient Instructions (Signed)

## 2014-09-18 NOTE — Progress Notes (Signed)
Patient ID: Kelly Ingram, female   DOB: 07-07-1950, 64 y.o.   MRN: [id number] 64 y.o. G65P0010 Married Caucasian female here for annual exam.    Off HRT and wants to remain off.  Having night sweats.  Symptoms are better during the day.  Off all supplements. Did not tolerate Effexor.   No vaginal bleeding or spotting.   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   Has calcium in her diet.   Going to [place] for the summer.    PCP:  Reynold Bowen, MD   Patient's last menstrual period was 05/17/1998.          Sexually active: Yes.   female partner The current method of family planning is vasectomy.    Exercising: Yes.    weight training. Smoker:  no  Health Maintenance: Pap:  08-28-12 wnl:neg HR HPV History of abnormal Pap:  no MMG:  10-01-13 fibroglandular density/nl:[facility] Colonoscopy:  09-21-12 normal with Dr. Henrene Pastor.  Next due 09/2022. BMD:   09-19-13:[facility]   Result  Osteopenia hip/spine--repeat study 2 years TDaP:  05-17-08 Screening Labs:  Hb today: PCP, Urine today: PCP   reports that she has never smoked. She has never used smokeless tobacco. She reports that she drinks about 3.0 oz of alcohol per week. She reports that she does not use illicit drugs.  Past Medical History  Diagnosis Date  . Seasonal allergies   . Thyroid disease 2008    hx thyroid cancer--bilateral nodules  . IBS (irritable bowel syndrome)   . Cancer 2008    thyroid  . Endometrial polyp 2/15    benign    Past Surgical History  Procedure Laterality Date  . Total thyroidectomy  2008  . Vocal cord implant Right 2009  . Shoulder surgery Left   . Colonoscopy      12 yrs ago-normal  . Dilation and curettage of uterus    . Dilatation & currettage/hysteroscopy with resectocope N/A 07/03/2013    Procedure: DILATATION & CURETTAGE/HYSTEROSCOPY WITH RESECTOCOPE;  Surgeon: Jamey Reas de Berton Lan, MD;   Location: [facility];  Service: Gynecology;  Laterality: N/A;    Current Outpatient Prescriptions  Medication Sig Dispense Refill  . clobetasol (OLUX) 0.05 % topical foam Apply 1 application topically daily.    . fexofenadine (ALLEGRA) 180 MG tablet Take 180 mg by mouth daily.    Marland Kitchen levothyroxine (SYNTHROID, LEVOTHROID) 125 MCG tablet Take 125 mcg by mouth daily before breakfast.    . Multiple Vitamin (STRESSTABS PO) Take 1 tablet by mouth daily.    . polycarbophil (FIBERCON) 625 MG tablet Take 625 mg by mouth daily.    . Vitamin D, Cholecalciferol, 1000 UNITS CAPS Take 5,000 Units by mouth daily.    . Probiotic Product (PROBIOTIC DAILY PO) Take 1 capsule by mouth daily.     No current facility-administered medications for this visit.    Family History  Problem Relation Age of Onset  . Thyroid disease Mother   . Hypertension Paternal Grandmother   . Hypertension Paternal Grandfather   . Colon cancer Neg Hx     ROS:  Pertinent items are noted in HPI.  Otherwise, a comprehensive ROS was negative.  Exam:   BP 120/78 mmHg  Pulse 66  Resp 14  Ht 5' 3.5" (1.613 m)  Wt 149 lb 3.2 oz (67.677 kg)  BMI 26.01 kg/m2  LMP 05/17/1998    General appearance: alert,  cooperative and appears stated age Head: Normocephalic, without obvious abnormality, atraumatic Neck: no adenopathy, supple, symmetrical, trachea midline and thyroid - absent Lungs: clear to auscultation bilaterally Breasts: normal appearance, no masses or tenderness, Inspection negative, No nipple retraction or dimpling, No nipple discharge or bleeding, No axillary or supraclavicular adenopathy Heart: regular rate and rhythm Abdomen: soft, non-tender; bowel sounds normal; no masses,  no organomegaly Extremities: extremities normal, atraumatic, no cyanosis or edema Skin: Skin color, texture, turgor normal. No rashes or lesions Lymph nodes: Cervical, supraclavicular, and axillary nodes normal. No abnormal inguinal nodes  palpated Neurologic: Grossly normal  Pelvic: External genitalia:  no lesions              Urethra:  normal appearing urethra with no masses, tenderness or lesions              Bartholins and Skenes: normal                 Vagina: normal appearing vagina with normal color and discharge, no lesions              Cervix: no lesions              Pap taken: Yes.   Bimanual Exam:  Uterus:  normal size, contour, position, consistency, mobility, non-tender              Adnexa: normal adnexa and no mass, fullness, tenderness              Rectovaginal: Yes.  .  Confirms.              Anus:  normal sphincter tone, no lesions  Chaperone was present for exam.  Assessment:   Well woman visit with normal exam. Osteopenia.   Plan: Yearly mammogram recommended after age 64.  Will schedule for the fall when back in [place].  Recommended self breast exam.  Pap and HR HPV as above. Discussed Calcium, Vitamin D, regular exercise program including cardiovascular and weight bearing exercise. Labs performed.  No..   See orders. Refills given on medications.  No..  See orders. Bone density next year. Follow up annually and prn.  Patient will decide if she will come yearly or every other year.      After visit summary provided.

## 2014-09-19 LAB — IPS PAP SMEAR ONLY

## 2015-03-10 DIAGNOSIS — L259 Unspecified contact dermatitis, unspecified cause: Secondary | ICD-10-CM | POA: Diagnosis not present

## 2015-03-10 DIAGNOSIS — L728 Other follicular cysts of the skin and subcutaneous tissue: Secondary | ICD-10-CM | POA: Diagnosis not present

## 2015-03-10 DIAGNOSIS — E890 Postprocedural hypothyroidism: Secondary | ICD-10-CM | POA: Diagnosis not present

## 2015-03-10 DIAGNOSIS — L299 Pruritus, unspecified: Secondary | ICD-10-CM | POA: Diagnosis not present

## 2015-03-10 DIAGNOSIS — Z0000 Encounter for general adult medical examination without abnormal findings: Secondary | ICD-10-CM | POA: Diagnosis not present

## 2015-03-10 DIAGNOSIS — M859 Disorder of bone density and structure, unspecified: Secondary | ICD-10-CM | POA: Diagnosis not present

## 2015-03-10 DIAGNOSIS — M8580 Other specified disorders of bone density and structure, unspecified site: Secondary | ICD-10-CM | POA: Diagnosis not present

## 2015-03-17 DIAGNOSIS — Z6826 Body mass index (BMI) 26.0-26.9, adult: Secondary | ICD-10-CM | POA: Diagnosis not present

## 2015-03-17 DIAGNOSIS — Z1389 Encounter for screening for other disorder: Secondary | ICD-10-CM | POA: Diagnosis not present

## 2015-03-17 DIAGNOSIS — M859 Disorder of bone density and structure, unspecified: Secondary | ICD-10-CM | POA: Diagnosis not present

## 2015-03-17 DIAGNOSIS — E890 Postprocedural hypothyroidism: Secondary | ICD-10-CM | POA: Diagnosis not present

## 2015-03-17 DIAGNOSIS — Z23 Encounter for immunization: Secondary | ICD-10-CM | POA: Diagnosis not present

## 2015-03-17 DIAGNOSIS — Z0000 Encounter for general adult medical examination without abnormal findings: Secondary | ICD-10-CM | POA: Diagnosis not present

## 2015-03-17 DIAGNOSIS — Z1212 Encounter for screening for malignant neoplasm of rectum: Secondary | ICD-10-CM | POA: Diagnosis not present

## 2015-03-17 DIAGNOSIS — L80 Vitiligo: Secondary | ICD-10-CM | POA: Diagnosis not present

## 2015-03-17 DIAGNOSIS — C73 Malignant neoplasm of thyroid gland: Secondary | ICD-10-CM | POA: Diagnosis not present

## 2015-08-25 DIAGNOSIS — Z0100 Encounter for examination of eyes and vision without abnormal findings: Secondary | ICD-10-CM | POA: Diagnosis not present

## 2015-08-25 DIAGNOSIS — H18413 Arcus senilis, bilateral: Secondary | ICD-10-CM | POA: Diagnosis not present

## 2015-08-25 DIAGNOSIS — H5200 Hypermetropia, unspecified eye: Secondary | ICD-10-CM | POA: Diagnosis not present

## 2015-09-04 DIAGNOSIS — M8580 Other specified disorders of bone density and structure, unspecified site: Secondary | ICD-10-CM | POA: Diagnosis not present

## 2015-09-04 DIAGNOSIS — Z6826 Body mass index (BMI) 26.0-26.9, adult: Secondary | ICD-10-CM | POA: Diagnosis not present

## 2015-09-04 DIAGNOSIS — M859 Disorder of bone density and structure, unspecified: Secondary | ICD-10-CM | POA: Diagnosis not present

## 2015-09-04 DIAGNOSIS — E890 Postprocedural hypothyroidism: Secondary | ICD-10-CM | POA: Diagnosis not present

## 2015-09-04 DIAGNOSIS — C73 Malignant neoplasm of thyroid gland: Secondary | ICD-10-CM | POA: Diagnosis not present

## 2015-09-09 DIAGNOSIS — C73 Malignant neoplasm of thyroid gland: Secondary | ICD-10-CM | POA: Diagnosis not present

## 2015-09-10 DIAGNOSIS — C73 Malignant neoplasm of thyroid gland: Secondary | ICD-10-CM | POA: Diagnosis not present

## 2015-09-10 DIAGNOSIS — M859 Disorder of bone density and structure, unspecified: Secondary | ICD-10-CM | POA: Diagnosis not present

## 2015-09-10 DIAGNOSIS — Z6826 Body mass index (BMI) 26.0-26.9, adult: Secondary | ICD-10-CM | POA: Diagnosis not present

## 2015-09-10 DIAGNOSIS — E890 Postprocedural hypothyroidism: Secondary | ICD-10-CM | POA: Diagnosis not present

## 2015-09-10 DIAGNOSIS — L80 Vitiligo: Secondary | ICD-10-CM | POA: Diagnosis not present

## 2015-09-24 ENCOUNTER — Encounter: Payer: Self-pay | Admitting: Obstetrics and Gynecology

## 2015-09-24 ENCOUNTER — Ambulatory Visit (INDEPENDENT_AMBULATORY_CARE_PROVIDER_SITE_OTHER): Payer: Medicare HMO | Admitting: Obstetrics and Gynecology

## 2015-09-24 VITALS — BP 138/80 | HR 60 | Resp 16 | Ht 63.5 in | Wt 153.8 lb

## 2015-09-24 DIAGNOSIS — M8580 Other specified disorders of bone density and structure, unspecified site: Secondary | ICD-10-CM

## 2015-09-24 DIAGNOSIS — L409 Psoriasis, unspecified: Secondary | ICD-10-CM | POA: Diagnosis not present

## 2015-09-24 DIAGNOSIS — Z01419 Encounter for gynecological examination (general) (routine) without abnormal findings: Secondary | ICD-10-CM

## 2015-09-24 NOTE — Progress Notes (Signed)
Patient ID: Kelly Ingram, female   DOB: 02-15-1951, 65 y.o.   MRN: [id number] 65 y.o. G38P0010 Married Caucasian female here for annual exam.    Feels great.   No vaginal bleeding or spotting.   Some night sweats but tolerable.   Sees an energy specialist.   Going to [place] in the near future.   PCP:  Reynold Bowen, MD  Patient's last menstrual period was 05/17/1998.           Sexually active: Yes.   female The current method of family planning is vasectomy.    Exercising: Yes.    weights and aerobics. Smoker:  no  Health Maintenance: Pap:  08-28-12 Neg:Neg HR HPV History of abnormal Pap:  no MMG:  10-01-13 3D/Density B/Neg/BiRads1:[facility] Colonoscopy:09-21-12 normal with Dr. Monna Fam due 09/2022. BMD:   09-19-13 Result: Osteopenia hip and Spine:[facility] TDaP:  05-17-08 Gardasil:   N/A HIV:  Will do with PCP. Hep C:  Will do with PCP. Screening Labs:  Hb today: PCP, Urine today: unable   reports that she has never smoked. She has never used smokeless tobacco. She reports that she drinks about 4.2 oz of alcohol per week. She reports that she does not use illicit drugs.  Past Medical History  Diagnosis Date  . Seasonal allergies   . Thyroid disease 2008    hx thyroid cancer--bilateral nodules  . IBS (irritable bowel syndrome)   . Cancer [facility]) 2008    thyroid  . Endometrial polyp 2/15    benign    Past Surgical History  Procedure Laterality Date  . Total thyroidectomy  2008  . Vocal cord implant Right 2009  . Shoulder surgery Left   . Colonoscopy      12 yrs ago-normal  . Dilation and curettage of uterus    . Dilatation & currettage/hysteroscopy with resectocope N/A 07/03/2013    Procedure: DILATATION & CURETTAGE/HYSTEROSCOPY WITH RESECTOCOPE;  Surgeon: Jamey Reas de Berton Lan, MD;  Location: [facility];  Service: Gynecology;  Laterality: N/A;    Current Outpatient Prescriptions  Medication Sig Dispense Refill  . clobetasol (TEMOVATE) 0.05  % external solution Apply 1 application topically as needed.    . fexofenadine (ALLEGRA) 180 MG tablet Take 180 mg by mouth daily.    Marland Kitchen levothyroxine (SYNTHROID, LEVOTHROID) 125 MCG tablet Take 125 mcg by mouth daily before breakfast.    . Magnesium 100 MG CAPS Take 1 capsule by mouth daily.    . Multiple Vitamin (STRESSTABS PO) Take 1 tablet by mouth daily.    . polycarbophil (FIBERCON) 625 MG tablet Take 625 mg by mouth daily.    . Probiotic Product (PROBIOTIC DAILY PO) Take 1 capsule by mouth daily.    . Vitamin D, Cholecalciferol, 1000 UNITS CAPS Take 5,000 Units by mouth daily.     No current facility-administered medications for this visit.    Family History  Problem Relation Age of Onset  . Thyroid disease Mother   . Hypertension Paternal Grandmother   . Hypertension Paternal Grandfather   . Colon cancer Neg Hx     ROS:  Pertinent items are noted in HPI.  Otherwise, a comprehensive ROS was negative.  Exam:   BP 138/80 mmHg  Pulse 60  Resp 16  Ht 5' 3.5" (1.613 m)  Wt 153 lb 12.8 oz (69.763 kg)  BMI 26.81 kg/m2  LMP 05/17/1998    General appearance: alert, cooperative and appears stated age Head: Normocephalic, without obvious abnormality,  atraumatic Neck: no adenopathy, supple, symmetrical, trachea midline and thyroid absent. Lungs: clear to auscultation bilaterally Breasts: normal appearance, no masses or tenderness, Inspection negative, No nipple retraction or dimpling, No nipple discharge or bleeding, No axillary or supraclavicular adenopathy Heart: regular rate and rhythm Abdomen: incisions:  No.   , soft, non-tender; no masses, no organomegaly Extremities: extremities normal, atraumatic, no cyanosis or edema Skin: Skin color, texture, turgor normal. No rashes or lesions Lymph nodes: Cervical, supraclavicular, and axillary nodes normal. No abnormal inguinal nodes palpated Neurologic: Grossly normal  Pelvic: External genitalia:  no lesions              Urethra:   normal appearing urethra with no masses, tenderness or lesions              Bartholins and Skenes: normal                 Vagina: normal appearing vagina with normal color and discharge, no lesions              Cervix: no lesions              Pap taken: Yes.   Bimanual Exam:  Uterus:  normal size, contour, position, consistency, mobility, non-tender              Adnexa: normal adnexa and no mass, fullness, tenderness              Rectal exam: Yes.  .  Confirms.              Anus:  normal sphincter tone, no lesions  Chaperone was present for exam.  Assessment:   Well woman visit with normal exam. Hx thyroid cancer.  Osteopenia.   Plan: Yearly mammogram recommended after age 63.  Patient will call to schedule at [facility]. Recommended self breast exam.  Pap and HR HPV as above. Discussed Calcium, Vitamin D, regular exercise program including cardiovascular and weight bearing exercise. Labs performed.  No..   Prescription medication(s) given.  No..   BMD this year.  [facility].  Follow up annually and prn.   After visit summary provided.

## 2015-09-24 NOTE — Patient Instructions (Signed)
Health Maintenance, Female Adopting a healthy lifestyle and getting preventive care can go a long way to promote health and wellness. Talk with your health care provider about what schedule of regular examinations is right for you. This is a good chance for you to check in with your provider about disease prevention and staying healthy. In between checkups, there are plenty of things you can do on your own. Experts have done a lot of research about which lifestyle changes and preventive measures are most likely to keep you healthy. Ask your health care provider for more information. WEIGHT AND DIET  Eat a healthy diet  Be sure to include plenty of vegetables, fruits, low-fat dairy products, and lean protein.  Do not eat a lot of foods high in solid fats, added sugars, or salt.  Get regular exercise. This is one of the most important things you can do for your health.  Most adults should exercise for at least 150 minutes each week. The exercise should increase your heart rate and make you sweat (moderate-intensity exercise).  Most adults should also do strengthening exercises at least twice a week. This is in addition to the moderate-intensity exercise.  Maintain a healthy weight  Body mass index (BMI) is a measurement that can be used to identify possible weight problems. It estimates body fat based on height and weight. Your health care provider can help determine your BMI and help you achieve or maintain a healthy weight.  For females 20 years of age and older:   A BMI below 18.5 is considered underweight.  A BMI of 18.5 to 24.9 is normal.  A BMI of 25 to 29.9 is considered overweight.  A BMI of 30 and above is considered obese.  Watch levels of cholesterol and blood lipids  You should start having your blood tested for lipids and cholesterol at 67 years of age, then have this test every 5 years.  You may need to have your cholesterol levels checked more often if:  Your lipid  or cholesterol levels are high.  You are older than 67 years of age.  You are at high risk for heart disease.  CANCER SCREENING   Lung Cancer  Lung cancer screening is recommended for adults 55-80 years old who are at high risk for lung cancer because of a history of smoking.  A yearly low-dose CT scan of the lungs is recommended for people who:  Currently smoke.  Have quit within the past 15 years.  Have at least a 30-pack-year history of smoking. A pack year is smoking an average of one pack of cigarettes a day for 1 year.  Yearly screening should continue until it has been 15 years since you quit.  Yearly screening should stop if you develop a health problem that would prevent you from having lung cancer treatment.  Breast Cancer  Practice breast self-awareness. This means understanding how your breasts normally appear and feel.  It also means doing regular breast self-exams. Let your health care provider know about any changes, no matter how small.  If you are in your 20s or 30s, you should have a clinical breast exam (CBE) by a health care provider every 1-3 years as part of a regular health exam.  If you are 40 or older, have a CBE every year. Also consider having a breast X-ray (mammogram) every year.  If you have a family history of breast cancer, talk to your health care provider about genetic screening.  If you   are at high risk for breast cancer, talk to your health care provider about having an MRI and a mammogram every year.  Breast cancer gene (BRCA) assessment is recommended for women who have family members with BRCA-related cancers. BRCA-related cancers include:  Breast.  Ovarian.  Tubal.  Peritoneal cancers.  Results of the assessment will determine the need for genetic counseling and BRCA1 and BRCA2 testing. Cervical Cancer Your health care provider may recommend that you be screened regularly for cancer of the pelvic organs (ovaries, uterus, and  vagina). This screening involves a pelvic examination, including checking for microscopic changes to the surface of your cervix (Pap test). You may be encouraged to have this screening done every 3 years, beginning at age 21.  For women ages 30-65, health care providers may recommend pelvic exams and Pap testing every 3 years, or they may recommend the Pap and pelvic exam, combined with testing for human papilloma virus (HPV), every 5 years. Some types of HPV increase your risk of cervical cancer. Testing for HPV may also be done on women of any age with unclear Pap test results.  Other health care providers may not recommend any screening for nonpregnant women who are considered low risk for pelvic cancer and who do not have symptoms. Ask your health care provider if a screening pelvic exam is right for you.  If you have had past treatment for cervical cancer or a condition that could lead to cancer, you need Pap tests and screening for cancer for at least 20 years after your treatment. If Pap tests have been discontinued, your risk factors (such as having a new sexual partner) need to be reassessed to determine if screening should resume. Some women have medical problems that increase the chance of getting cervical cancer. In these cases, your health care provider may recommend more frequent screening and Pap tests. Colorectal Cancer  This type of cancer can be detected and often prevented.  Routine colorectal cancer screening usually begins at 67 years of age and continues through 67 years of age.  Your health care provider may recommend screening at an earlier age if you have risk factors for colon cancer.  Your health care provider may also recommend using home test kits to check for hidden blood in the stool.  A small camera at the end of a tube can be used to examine your colon directly (sigmoidoscopy or colonoscopy). This is done to check for the earliest forms of colorectal  cancer.  Routine screening usually begins at age 50.  Direct examination of the colon should be repeated every 5-10 years through 67 years of age. However, you may need to be screened more often if early forms of precancerous polyps or small growths are found. Skin Cancer  Check your skin from head to toe regularly.  Tell your health care provider about any new moles or changes in moles, especially if there is a change in a mole's shape or color.  Also tell your health care provider if you have a mole that is larger than the size of a pencil eraser.  Always use sunscreen. Apply sunscreen liberally and repeatedly throughout the day.  Protect yourself by wearing long sleeves, pants, a wide-brimmed hat, and sunglasses whenever you are outside. HEART DISEASE, DIABETES, AND HIGH BLOOD PRESSURE   High blood pressure causes heart disease and increases the risk of stroke. High blood pressure is more likely to develop in:  People who have blood pressure in the high end   of the normal range (130-139/85-89 mm Hg).  People who are overweight or obese.  People who are African American.  If you are 38-23 years of age, have your blood pressure checked every 3-5 years. If you are 61 years of age or older, have your blood pressure checked every year. You should have your blood pressure measured twice--once when you are at a hospital or clinic, and once when you are not at a hospital or clinic. Record the average of the two measurements. To check your blood pressure when you are not at a hospital or clinic, you can use:  An automated blood pressure machine at a pharmacy.  A home blood pressure monitor.  If you are between 45 years and 39 years old, ask your health care provider if you should take aspirin to prevent strokes.  Have regular diabetes screenings. This involves taking a blood sample to check your fasting blood sugar level.  If you are at a normal weight and have a low risk for diabetes,  have this test once every three years after 68 years of age.  If you are overweight and have a high risk for diabetes, consider being tested at a younger age or more often. PREVENTING INFECTION  Hepatitis B  If you have a higher risk for hepatitis B, you should be screened for this virus. You are considered at high risk for hepatitis B if:  You were born in a country where hepatitis B is common. Ask your health care provider which countries are considered high risk.  Your parents were born in a high-risk country, and you have not been immunized against hepatitis B (hepatitis B vaccine).  You have HIV or AIDS.  You use needles to inject street drugs.  You live with someone who has hepatitis B.  You have had sex with someone who has hepatitis B.  You get hemodialysis treatment.  You take certain medicines for conditions, including cancer, organ transplantation, and autoimmune conditions. Hepatitis C  Blood testing is recommended for:  Everyone born from [birth year] through 1967.  Anyone with known risk factors for hepatitis C. Sexually transmitted infections (STIs)  You should be screened for sexually transmitted infections (STIs) including gonorrhea and chlamydia if:  You are sexually active and are younger than 67 years of age.  You are older than 67 years of age and your health care provider tells you that you are at risk for this type of infection.  Your sexual activity has changed since you were last screened and you are at an increased risk for chlamydia or gonorrhea. Ask your health care provider if you are at risk.  If you do not have HIV, but are at risk, it may be recommended that you take a prescription medicine daily to prevent HIV infection. This is called pre-exposure prophylaxis (PrEP). You are considered at risk if:  You are sexually active and do not regularly use condoms or know the HIV status of your partner(s).  You take drugs by injection.  You are sexually  active with a partner who has HIV. Talk with your health care provider about whether you are at high risk of being infected with HIV. If you choose to begin PrEP, you should first be tested for HIV. You should then be tested every 3 months for as long as you are taking PrEP.  PREGNANCY   If you are premenopausal and you may become pregnant, ask your health care provider about preconception counseling.  If you may  become pregnant, take 400 to 800 micrograms (mcg) of folic acid every day.  If you want to prevent pregnancy, talk to your health care provider about birth control (contraception). OSTEOPOROSIS AND MENOPAUSE   Osteoporosis is a disease in which the bones lose minerals and strength with aging. This can result in serious bone fractures. Your risk for osteoporosis can be identified using a bone density scan.  If you are 61 years of age or older, or if you are at risk for osteoporosis and fractures, ask your health care provider if you should be screened.  Ask your health care provider whether you should take a calcium or vitamin D supplement to lower your risk for osteoporosis.  Menopause may have certain physical symptoms and risks.  Hormone replacement therapy may reduce some of these symptoms and risks. Talk to your health care provider about whether hormone replacement therapy is right for you.  HOME CARE INSTRUCTIONS   Schedule regular health, dental, and eye exams.  Stay current with your immunizations.   Do not use any tobacco products including cigarettes, chewing tobacco, or electronic cigarettes.  If you are pregnant, do not drink alcohol.  If you are breastfeeding, limit how much and how often you drink alcohol.  Limit alcohol intake to no more than 1 drink per day for nonpregnant women. One drink equals 12 ounces of beer, 5 ounces of wine, or 1 ounces of hard liquor.  Do not use street drugs.  Do not share needles.  Ask your health care provider for help if  you need support or information about quitting drugs.  Tell your health care provider if you often feel depressed.  Tell your health care provider if you have ever been abused or do not feel safe at home.   This information is not intended to replace advice given to you by your health care provider. Make sure you discuss any questions you have with your health care provider.   Document Released: 11/16/2010 Document Revised: 05/24/2014 Document Reviewed: 04/04/2013 Elsevier Interactive Patient Education [employer].

## 2015-09-25 LAB — IPS PAP SMEAR ONLY

## 2015-09-26 ENCOUNTER — Other Ambulatory Visit: Payer: Self-pay | Admitting: Obstetrics and Gynecology

## 2015-09-26 DIAGNOSIS — Z1231 Encounter for screening mammogram for malignant neoplasm of breast: Secondary | ICD-10-CM

## 2015-09-30 ENCOUNTER — Ambulatory Visit
Admission: RE | Admit: 2015-09-30 | Discharge: 2015-09-30 | Disposition: A | Discharge status: Home / Self Care | Payer: Medicare HMO | Source: Ambulatory Visit | Attending: Obstetrics and Gynecology | Admitting: Obstetrics and Gynecology

## 2015-09-30 DIAGNOSIS — Z1231 Encounter for screening mammogram for malignant neoplasm of breast: Secondary | ICD-10-CM | POA: Diagnosis not present

## 2015-10-06 ENCOUNTER — Ambulatory Visit
Admission: RE | Admit: 2015-10-06 | Discharge: 2015-10-06 | Disposition: A | Discharge status: Home / Self Care | Payer: Medicare HMO | Source: Ambulatory Visit | Attending: Obstetrics and Gynecology | Admitting: Obstetrics and Gynecology

## 2015-10-06 DIAGNOSIS — M8589 Other specified disorders of bone density and structure, multiple sites: Secondary | ICD-10-CM | POA: Diagnosis not present

## 2015-10-06 DIAGNOSIS — Z780 Asymptomatic menopausal state: Secondary | ICD-10-CM | POA: Diagnosis not present

## 2015-10-06 DIAGNOSIS — M8588 Other specified disorders of bone density and structure, other site: Secondary | ICD-10-CM | POA: Diagnosis not present

## 2015-10-06 DIAGNOSIS — M8580 Other specified disorders of bone density and structure, unspecified site: Secondary | ICD-10-CM

## 2016-01-13 DIAGNOSIS — L309 Dermatitis, unspecified: Secondary | ICD-10-CM | POA: Diagnosis not present

## 2016-01-13 DIAGNOSIS — L299 Pruritus, unspecified: Secondary | ICD-10-CM | POA: Diagnosis not present

## 2016-02-02 DIAGNOSIS — H3589 Other specified retinal disorders: Secondary | ICD-10-CM | POA: Diagnosis not present

## 2016-02-02 DIAGNOSIS — H2513 Age-related nuclear cataract, bilateral: Secondary | ICD-10-CM | POA: Diagnosis not present

## 2016-02-02 DIAGNOSIS — H35372 Puckering of macula, left eye: Secondary | ICD-10-CM | POA: Diagnosis not present

## 2016-02-02 DIAGNOSIS — H18413 Arcus senilis, bilateral: Secondary | ICD-10-CM | POA: Diagnosis not present

## 2016-02-24 DIAGNOSIS — D239 Other benign neoplasm of skin, unspecified: Secondary | ICD-10-CM | POA: Diagnosis not present

## 2016-02-24 DIAGNOSIS — L820 Inflamed seborrheic keratosis: Secondary | ICD-10-CM | POA: Diagnosis not present

## 2016-02-24 DIAGNOSIS — H02739 Vitiligo of unspecified eye, unspecified eyelid and periocular area: Secondary | ICD-10-CM | POA: Diagnosis not present

## 2016-03-31 DIAGNOSIS — Z0000 Encounter for general adult medical examination without abnormal findings: Secondary | ICD-10-CM | POA: Diagnosis not present

## 2016-03-31 DIAGNOSIS — E890 Postprocedural hypothyroidism: Secondary | ICD-10-CM | POA: Diagnosis not present

## 2016-03-31 DIAGNOSIS — C73 Malignant neoplasm of thyroid gland: Secondary | ICD-10-CM | POA: Diagnosis not present

## 2016-03-31 DIAGNOSIS — M859 Disorder of bone density and structure, unspecified: Secondary | ICD-10-CM | POA: Diagnosis not present

## 2016-04-13 DIAGNOSIS — Z0000 Encounter for general adult medical examination without abnormal findings: Secondary | ICD-10-CM | POA: Diagnosis not present

## 2016-05-03 DIAGNOSIS — Z1212 Encounter for screening for malignant neoplasm of rectum: Secondary | ICD-10-CM | POA: Diagnosis not present

## 2016-06-07 DIAGNOSIS — J40 Bronchitis, not specified as acute or chronic: Secondary | ICD-10-CM | POA: Diagnosis not present

## 2016-06-07 DIAGNOSIS — R05 Cough: Secondary | ICD-10-CM | POA: Diagnosis not present

## 2016-06-07 DIAGNOSIS — J019 Acute sinusitis, unspecified: Secondary | ICD-10-CM | POA: Diagnosis not present

## 2016-06-07 DIAGNOSIS — Z6826 Body mass index (BMI) 26.0-26.9, adult: Secondary | ICD-10-CM | POA: Diagnosis not present

## 2016-06-08 DIAGNOSIS — Z79899 Other long term (current) drug therapy: Secondary | ICD-10-CM | POA: Diagnosis not present

## 2016-06-08 DIAGNOSIS — Z85850 Personal history of malignant neoplasm of thyroid: Secondary | ICD-10-CM | POA: Diagnosis not present

## 2016-06-08 DIAGNOSIS — Z6826 Body mass index (BMI) 26.0-26.9, adult: Secondary | ICD-10-CM | POA: Diagnosis not present

## 2016-06-08 DIAGNOSIS — Z0000 Encounter for general adult medical examination without abnormal findings: Secondary | ICD-10-CM | POA: Diagnosis not present

## 2016-08-14 DIAGNOSIS — S40012A Contusion of left shoulder, initial encounter: Secondary | ICD-10-CM | POA: Diagnosis not present

## 2016-08-14 DIAGNOSIS — S80212A Abrasion, left knee, initial encounter: Secondary | ICD-10-CM | POA: Diagnosis not present

## 2016-08-14 DIAGNOSIS — S63502A Unspecified sprain of left wrist, initial encounter: Secondary | ICD-10-CM | POA: Diagnosis not present

## 2016-08-30 DIAGNOSIS — S60212A Contusion of left wrist, initial encounter: Secondary | ICD-10-CM | POA: Diagnosis not present

## 2016-09-08 DIAGNOSIS — R05 Cough: Secondary | ICD-10-CM | POA: Diagnosis not present

## 2016-09-08 DIAGNOSIS — J45909 Unspecified asthma, uncomplicated: Secondary | ICD-10-CM | POA: Diagnosis not present

## 2016-09-08 DIAGNOSIS — J029 Acute pharyngitis, unspecified: Secondary | ICD-10-CM | POA: Diagnosis not present

## 2016-09-08 DIAGNOSIS — J302 Other seasonal allergic rhinitis: Secondary | ICD-10-CM | POA: Diagnosis not present

## 2016-09-14 DIAGNOSIS — C73 Malignant neoplasm of thyroid gland: Secondary | ICD-10-CM | POA: Diagnosis not present

## 2016-09-20 DIAGNOSIS — R69 Illness, unspecified: Secondary | ICD-10-CM | POA: Diagnosis not present

## 2016-09-21 DIAGNOSIS — S60212D Contusion of left wrist, subsequent encounter: Secondary | ICD-10-CM | POA: Diagnosis not present

## 2016-09-21 DIAGNOSIS — S8002XA Contusion of left knee, initial encounter: Secondary | ICD-10-CM | POA: Diagnosis not present

## 2016-09-22 DIAGNOSIS — Z1389 Encounter for screening for other disorder: Secondary | ICD-10-CM | POA: Diagnosis not present

## 2016-09-22 DIAGNOSIS — M859 Disorder of bone density and structure, unspecified: Secondary | ICD-10-CM | POA: Diagnosis not present

## 2016-09-22 DIAGNOSIS — Z6827 Body mass index (BMI) 27.0-27.9, adult: Secondary | ICD-10-CM | POA: Diagnosis not present

## 2016-09-22 DIAGNOSIS — E890 Postprocedural hypothyroidism: Secondary | ICD-10-CM | POA: Diagnosis not present

## 2016-09-22 DIAGNOSIS — D126 Benign neoplasm of colon, unspecified: Secondary | ICD-10-CM | POA: Diagnosis not present

## 2016-09-22 DIAGNOSIS — C73 Malignant neoplasm of thyroid gland: Secondary | ICD-10-CM | POA: Diagnosis not present

## 2016-09-22 DIAGNOSIS — N6019 Diffuse cystic mastopathy of unspecified breast: Secondary | ICD-10-CM | POA: Diagnosis not present

## 2016-09-24 ENCOUNTER — Ambulatory Visit: Payer: Medicare HMO | Admitting: Obstetrics and Gynecology

## 2016-11-10 DIAGNOSIS — B353 Tinea pedis: Secondary | ICD-10-CM | POA: Diagnosis not present

## 2016-11-10 DIAGNOSIS — L719 Rosacea, unspecified: Secondary | ICD-10-CM | POA: Diagnosis not present

## 2016-11-22 ENCOUNTER — Telehealth: Payer: Self-pay | Admitting: Obstetrics and Gynecology

## 2016-11-22 NOTE — Telephone Encounter (Signed)
Left message on voicemail to call and reschedule cancelled appointment. °

## 2017-01-14 ENCOUNTER — Ambulatory Visit (INDEPENDENT_AMBULATORY_CARE_PROVIDER_SITE_OTHER): Payer: Medicare HMO | Admitting: Obstetrics and Gynecology

## 2017-01-14 ENCOUNTER — Encounter: Payer: Self-pay | Admitting: Obstetrics and Gynecology

## 2017-01-14 VITALS — BP 118/74 | HR 76 | Resp 16 | Ht 63.25 in | Wt 157.0 lb

## 2017-01-14 DIAGNOSIS — Z01419 Encounter for gynecological examination (general) (routine) without abnormal findings: Secondary | ICD-10-CM | POA: Diagnosis not present

## 2017-01-14 NOTE — Progress Notes (Signed)
66 y.o. [id number] Married Caucasian female here for annual exam.    Parents are living at age 34.   Rectal area is sore and itching.  Was using tumeric in liquid form and caused diarrhea, which resolved.  Not painful now.   PCP: Dr. Reynold Bowen    Patient's last menstrual period was 05/17/1998.           Sexually active: Yes.    The current method of family planning is vasectomy.    Exercising: Yes.    weights and aerobics Smoker:  no  Health Maintenance: Pap:  09/24/15 Pap Negative  09/18/14 Negative  08-28-12 Neg:Neg HR HPV History of abnormal Pap:  no MMG:  09/30/15 BIRADS 1 negative/density c -- patient will schedule Colonoscopy:  09-21-12 normal with Dr. Monna Fam due 09/2022 BMD:   10-06-15 Result: [place] TDaP:  2010 Gardasil: N/a HIV: not done Hep C: not done - will discuss with PCP Screening Labs:  PCP takes care of labs   reports that she has never smoked. She has never used smokeless tobacco. She reports that she drinks about 4.2 oz of alcohol per week . She reports that she does not use drugs.  Past Medical History:  Diagnosis Date  . Cancer [facility]) 2008   thyroid  . Endometrial polyp 2/15   benign  . IBS (irritable bowel syndrome)   . Seasonal allergies   . Thyroid disease 2008   hx thyroid cancer--bilateral nodules    Past Surgical History:  Procedure Laterality Date  . COLONOSCOPY     12 yrs ago-normal  . DILATATION & CURRETTAGE/HYSTEROSCOPY WITH RESECTOCOPE N/A 07/03/2013   Procedure: DILATATION & CURETTAGE/HYSTEROSCOPY WITH RESECTOCOPE;  Surgeon: Jamey Reas de Berton Lan, MD;  Location: [facility];  Service: Gynecology;  Laterality: N/A;  . DILATION AND CURETTAGE OF UTERUS    . SHOULDER SURGERY Left   . TOTAL THYROIDECTOMY  2008  . vocal cord implant Right 2009    Current Outpatient Prescriptions  Medication Sig Dispense Refill  . fexofenadine (ALLEGRA) 180 MG tablet Take 180 mg by mouth daily.    Marland Kitchen levothyroxine  (SYNTHROID, LEVOTHROID) 125 MCG tablet Take 125 mcg by mouth daily before breakfast.    . Magnesium 100 MG CAPS Take 1 capsule by mouth daily.    . Multiple Vitamin (STRESSTABS PO) Take 1 tablet by mouth daily.    . polycarbophil (FIBERCON) 625 MG tablet Take 625 mg by mouth daily.    . Probiotic Product (PROBIOTIC DAILY PO) Take 1 capsule by mouth daily.    . Vitamin D, Cholecalciferol, 1000 UNITS CAPS Take 5,000 Units by mouth daily.     No current facility-administered medications for this visit.     Family History  Problem Relation Age of Onset  . Thyroid disease Mother   . Hypertension Paternal Grandmother   . Hypertension Paternal Grandfather   . Colon cancer Neg Hx     ROS:  Pertinent items are noted in HPI.  Otherwise, a comprehensive ROS was negative.  Exam:   BP 118/74 (BP Location: Right Arm, Patient Position: Sitting, Cuff Size: Normal)   Pulse 76   Resp 16   Ht 5' 3.25" (1.607 m)   Wt 157 lb (71.2 kg)   LMP 05/17/1998 Comment: pt had bleeding from polyp 2/15  BMI 27.59 kg/m     General appearance: alert, cooperative and appears stated age Head: Normocephalic, without obvious abnormality, atraumatic Neck: no adenopathy, supple, symmetrical, trachea midline and thyroid  normal to inspection and palpation Lungs: clear to auscultation bilaterally Breasts: normal appearance, no masses or tenderness, No nipple retraction or dimpling, No nipple discharge or bleeding, No axillary or supraclavicular adenopathy Heart: regular rate and rhythm Abdomen: soft, non-tender; no masses, no organomegaly Extremities: extremities normal, atraumatic, no cyanosis or edema Skin: Skin color, texture, turgor normal. No rashes or lesions Lymph nodes: Cervical, supraclavicular, and axillary nodes normal. No abnormal inguinal nodes palpated Neurologic: Grossly normal  Pelvic: External genitalia:  no lesions              Urethra:  normal appearing urethra with no masses, tenderness or  lesions              Bartholins and Skenes: normal                 Vagina: normal appearing vagina with normal color and discharge, no lesions              Cervix: no lesions              Pap taken: No. Bimanual Exam:  Uterus:  normal size, contour, position, consistency, mobility, non-tender              Adnexa: no mass, fullness, tenderness              Rectal exam: Yes.  .  Confirms.              Anus:  normal sphincter tone, no lesions  Chaperone was present for exam.  Assessment:   Well woman visit with normal exam. Hx thyroid CA.  Osteopenia.  Vaginal atrophy.  Plan: Mammogram screening discussed.  She will schedule at [facility]. Recommended self breast awareness. Pap and HR HPV as above. Guidelines for Calcium, Vitamin D, regular exercise program including cardiovascular and weight bearing exercise. Labs and vaccines with PCP.  BMD next year.  Declines local vaginal estrogen.  I discussed water based lubricants, cooking oils, and vit E suppos. Follow up annually and prn.     After visit summary provided.

## 2017-01-14 NOTE — Patient Instructions (Signed)

## 2017-01-18 ENCOUNTER — Other Ambulatory Visit: Payer: Self-pay | Admitting: Obstetrics and Gynecology

## 2017-01-18 DIAGNOSIS — Z1231 Encounter for screening mammogram for malignant neoplasm of breast: Secondary | ICD-10-CM

## 2017-01-19 ENCOUNTER — Ambulatory Visit: Payer: Medicare HMO | Admitting: Obstetrics and Gynecology

## 2017-01-21 DIAGNOSIS — H6121 Impacted cerumen, right ear: Secondary | ICD-10-CM | POA: Diagnosis not present

## 2017-01-21 DIAGNOSIS — Z6827 Body mass index (BMI) 27.0-27.9, adult: Secondary | ICD-10-CM | POA: Diagnosis not present

## 2017-01-21 DIAGNOSIS — W57XXXA Bitten or stung by nonvenomous insect and other nonvenomous arthropods, initial encounter: Secondary | ICD-10-CM | POA: Diagnosis not present

## 2017-01-25 ENCOUNTER — Ambulatory Visit
Admission: RE | Admit: 2017-01-25 | Discharge: 2017-01-25 | Disposition: A | Discharge status: Home / Self Care | Payer: Medicare HMO | Source: Ambulatory Visit | Attending: Obstetrics and Gynecology | Admitting: Obstetrics and Gynecology

## 2017-01-25 DIAGNOSIS — Z1231 Encounter for screening mammogram for malignant neoplasm of breast: Secondary | ICD-10-CM | POA: Diagnosis not present

## 2017-02-15 DIAGNOSIS — D229 Melanocytic nevi, unspecified: Secondary | ICD-10-CM | POA: Diagnosis not present

## 2017-02-15 DIAGNOSIS — L309 Dermatitis, unspecified: Secondary | ICD-10-CM | POA: Diagnosis not present

## 2017-03-28 DIAGNOSIS — R21 Rash and other nonspecific skin eruption: Secondary | ICD-10-CM | POA: Diagnosis not present

## 2017-03-28 DIAGNOSIS — Z6827 Body mass index (BMI) 27.0-27.9, adult: Secondary | ICD-10-CM | POA: Diagnosis not present

## 2017-03-29 DIAGNOSIS — L509 Urticaria, unspecified: Secondary | ICD-10-CM | POA: Diagnosis not present

## 2017-03-29 DIAGNOSIS — Z5181 Encounter for therapeutic drug level monitoring: Secondary | ICD-10-CM | POA: Diagnosis not present

## 2017-04-11 DIAGNOSIS — R82998 Other abnormal findings in urine: Secondary | ICD-10-CM | POA: Diagnosis not present

## 2017-04-11 DIAGNOSIS — C73 Malignant neoplasm of thyroid gland: Secondary | ICD-10-CM | POA: Diagnosis not present

## 2017-04-11 DIAGNOSIS — Z0000 Encounter for general adult medical examination without abnormal findings: Secondary | ICD-10-CM | POA: Diagnosis not present

## 2017-04-11 DIAGNOSIS — M859 Disorder of bone density and structure, unspecified: Secondary | ICD-10-CM | POA: Diagnosis not present

## 2017-04-19 DIAGNOSIS — Z0000 Encounter for general adult medical examination without abnormal findings: Secondary | ICD-10-CM | POA: Diagnosis not present

## 2017-04-19 DIAGNOSIS — Z6835 Body mass index (BMI) 35.0-35.9, adult: Secondary | ICD-10-CM | POA: Diagnosis not present

## 2017-04-19 DIAGNOSIS — D126 Benign neoplasm of colon, unspecified: Secondary | ICD-10-CM | POA: Diagnosis not present

## 2017-04-19 DIAGNOSIS — C73 Malignant neoplasm of thyroid gland: Secondary | ICD-10-CM | POA: Diagnosis not present

## 2017-04-19 DIAGNOSIS — L509 Urticaria, unspecified: Secondary | ICD-10-CM | POA: Diagnosis not present

## 2017-04-19 DIAGNOSIS — E890 Postprocedural hypothyroidism: Secondary | ICD-10-CM | POA: Diagnosis not present

## 2017-04-19 DIAGNOSIS — M8580 Other specified disorders of bone density and structure, unspecified site: Secondary | ICD-10-CM | POA: Diagnosis not present

## 2017-04-19 DIAGNOSIS — N6019 Diffuse cystic mastopathy of unspecified breast: Secondary | ICD-10-CM | POA: Diagnosis not present

## 2017-04-19 DIAGNOSIS — Z1389 Encounter for screening for other disorder: Secondary | ICD-10-CM | POA: Diagnosis not present

## 2017-04-27 DIAGNOSIS — Z1212 Encounter for screening for malignant neoplasm of rectum: Secondary | ICD-10-CM | POA: Diagnosis not present

## 2017-06-17 DIAGNOSIS — J029 Acute pharyngitis, unspecified: Secondary | ICD-10-CM | POA: Diagnosis not present

## 2017-06-17 DIAGNOSIS — Z6826 Body mass index (BMI) 26.0-26.9, adult: Secondary | ICD-10-CM | POA: Diagnosis not present

## 2017-06-17 DIAGNOSIS — J209 Acute bronchitis, unspecified: Secondary | ICD-10-CM | POA: Diagnosis not present

## 2017-07-07 DIAGNOSIS — R69 Illness, unspecified: Secondary | ICD-10-CM | POA: Diagnosis not present

## 2017-08-17 DIAGNOSIS — K648 Other hemorrhoids: Secondary | ICD-10-CM | POA: Diagnosis not present

## 2017-08-17 DIAGNOSIS — K626 Ulcer of anus and rectum: Secondary | ICD-10-CM | POA: Diagnosis not present

## 2017-08-17 DIAGNOSIS — E039 Hypothyroidism, unspecified: Secondary | ICD-10-CM | POA: Insufficient documentation

## 2017-08-17 DIAGNOSIS — M8580 Other specified disorders of bone density and structure, unspecified site: Secondary | ICD-10-CM | POA: Insufficient documentation

## 2017-08-30 DIAGNOSIS — H5200 Hypermetropia, unspecified eye: Secondary | ICD-10-CM | POA: Diagnosis not present

## 2017-08-30 DIAGNOSIS — Z0101 Encounter for examination of eyes and vision with abnormal findings: Secondary | ICD-10-CM | POA: Diagnosis not present

## 2017-12-13 DIAGNOSIS — B353 Tinea pedis: Secondary | ICD-10-CM | POA: Diagnosis not present

## 2017-12-13 DIAGNOSIS — L719 Rosacea, unspecified: Secondary | ICD-10-CM | POA: Diagnosis not present

## 2018-01-09 ENCOUNTER — Ambulatory Visit: Payer: Medicare HMO | Admitting: Podiatry

## 2018-01-09 ENCOUNTER — Encounter: Payer: Self-pay | Admitting: Podiatry

## 2018-01-09 DIAGNOSIS — L600 Ingrowing nail: Secondary | ICD-10-CM

## 2018-01-09 DIAGNOSIS — B351 Tinea unguium: Secondary | ICD-10-CM

## 2018-01-09 NOTE — Progress Notes (Signed)
Subjective:    Patient ID: Kelly Ingram, female    DOB: Jul 14, 1950, 67 y.o.   MRN: [id number]  HPI  67 year old female presents the office today for concerns left big toenail becoming thickened discolored she does not look healthy.  Start the current pain.  She denies any significant pain to the area she denies any redness or drainage or any swelling.  She said no recent treatment.  She has no other concerns today.  Review of Systems  All other systems reviewed and are negative.  Past Medical History:  Diagnosis Date  . Cancer [facility]) 2008   thyroid  . Endometrial polyp 2/15   benign  . IBS (irritable bowel syndrome)   . Seasonal allergies   . Thyroid disease 2008   hx thyroid cancer--bilateral nodules    Past Surgical History:  Procedure Laterality Date  . COLONOSCOPY     12 yrs ago-normal  . DILATATION & CURRETTAGE/HYSTEROSCOPY WITH RESECTOCOPE N/A 07/03/2013   Procedure: DILATATION & CURETTAGE/HYSTEROSCOPY WITH RESECTOCOPE;  Surgeon: Jamey Reas de Berton Lan, MD;  Location: [facility];  Service: Gynecology;  Laterality: N/A;  . DILATION AND CURETTAGE OF UTERUS    . SHOULDER SURGERY Left   . TOTAL THYROIDECTOMY  2008  . vocal cord implant Right 2009     Current Outpatient Medications:  .  NON FORMULARY, [facility]  Onychomycosis Nail Lacquer -  Fluconazole 2%, Terbinafine 1% DMSO Apply to affected nail once daily Qty. 120 gm 3 refills, Disp: , Rfl:  .  TURMERIC PO, Take by mouth., Disp: , Rfl:  .  fexofenadine (ALLEGRA) 180 MG tablet, Take 180 mg by mouth daily., Disp: , Rfl:  .  levothyroxine (SYNTHROID, LEVOTHROID) 125 MCG tablet, Take 125 mcg by mouth daily before breakfast., Disp: , Rfl:  .  Magnesium 100 MG CAPS, Take 1 capsule by mouth daily., Disp: , Rfl:  .  Multiple Vitamin (STRESSTABS PO), Take 1 tablet by mouth daily., Disp: , Rfl:  .  Probiotic Product (PROBIOTIC DAILY PO), Take 1 capsule by mouth daily., Disp: , Rfl:  .  Vitamin D,  Cholecalciferol, 1000 UNITS CAPS, Take 5,000 Units by mouth daily., Disp: , Rfl:   Allergies  Allergen Reactions  . Effexor [Venlafaxine] Other (See Comments)    Cognitive change, tremors. - July 30, 2013.  Marland Kitchen Erythromycin Nausea Only    GI upset.  . Adhesive [Tape]         Objective:   Physical Exam General: AAO x3, NAD  Dermatological: Left hallux toenails hypertrophic, dystrophic ill-defined discoloration.  There is incurvation present to the medial aspect.  There is no edema, erythema, drainage or pus there is no clinical signs of infection noted today.  No open lesions.  Vascular: Dorsalis Pedis artery and Posterior Tibial artery pedal pulses are 2/4 bilateral with immedate capillary fill time.  There is no pain with calf compression, swelling, warmth, erythema.   Neruologic: Grossly intact via light touch bilateral. Protective threshold with Semmes Wienstein monofilament intact to all pedal sites bilateral.   Musculoskeletal: No gross boney pedal deformities bilateral. No pain, crepitus, or limitation noted with foot and ankle range of motion bilateral. Muscular strength 5/5 in all groups tested bilateral.  Gait: Unassisted, Nonantalgic.     Assessment & Plan:  67 year old female left hallux onychodystrophy, onychomycosis -Treatment options discussed including all alternatives, risks, and complications -Etiology of symptoms were discussed -Nails debrided specimens for culture.  She is contemplating laser treatment.  We are  going to start a topical antifungal I ordered this today through [employer].  She declines oral medication.  She also declines nail avulsion at this point.  Trula Slade DPM

## 2018-01-12 ENCOUNTER — Telehealth: Payer: Self-pay | Admitting: Podiatry

## 2018-01-12 NOTE — Telephone Encounter (Signed)
I spoke with pt and informed she should use the Nail Lacquer daily. Pt asked if the lab results were available. I told pt the fungal culture was sent out 01/09/2018 and it may take 4-6 weeks to return and we would call with the results. Pt states understanding.

## 2018-01-12 NOTE — Telephone Encounter (Signed)
I saw Dr. Jacqualyn Posey last week and I have a prescription for a compound which arrived yesterday. I do not have directions for use. Do I apply once or twice a day to the toenail? Directions on the compound say to use as directed by physician, so I need to know what those directions are. You can reach me at [phone]. Thank you.

## 2018-01-23 DIAGNOSIS — L718 Other rosacea: Secondary | ICD-10-CM | POA: Diagnosis not present

## 2018-01-23 DIAGNOSIS — L821 Other seborrheic keratosis: Secondary | ICD-10-CM | POA: Diagnosis not present

## 2018-01-23 DIAGNOSIS — D2372 Other benign neoplasm of skin of left lower limb, including hip: Secondary | ICD-10-CM | POA: Diagnosis not present

## 2018-01-23 DIAGNOSIS — L218 Other seborrheic dermatitis: Secondary | ICD-10-CM | POA: Diagnosis not present

## 2018-01-23 DIAGNOSIS — I788 Other diseases of capillaries: Secondary | ICD-10-CM | POA: Diagnosis not present

## 2018-01-25 ENCOUNTER — Telehealth: Payer: Self-pay | Admitting: Podiatry

## 2018-01-25 NOTE — Telephone Encounter (Signed)
I received an e-mail yesterday with updated test results and I don't know how to read it because it just says final test results. So if someone could call me back and let me know the test results from the test taken on 30 August and I did not get the results until 10 September. My number is [phone]. Thank you.

## 2018-01-26 ENCOUNTER — Telehealth: Payer: Self-pay | Admitting: Podiatry

## 2018-01-26 NOTE — Telephone Encounter (Signed)
Pt states she had called for the results to be left on her voicemail, when your voicemail came in with the results and recommendation, thank you for the call and I will call to schedule laser.

## 2018-01-26 NOTE — Telephone Encounter (Signed)
Left message for pt to call for results  

## 2018-01-26 NOTE — Telephone Encounter (Signed)
Left message at pt's request in Telephone Call, with Dr. Leigh Aurora 01/26/2018 8:12am review of fungal results and orders.

## 2018-01-26 NOTE — Telephone Encounter (Signed)
Will call and leave voicemail message if pt is not available.

## 2018-01-26 NOTE — Telephone Encounter (Signed)
Marcy Siren just called a few minutes ago with lab results and treatment. Marcy Siren, you are welcome to leave that on my voicemail if it would be easier so we don't call back and forth all day. Thank you for calling. Bye bye.

## 2018-01-26 NOTE — Telephone Encounter (Signed)
I'm trying again to reach the nurse, Marcy Siren. I'm trying to get results of lab work and treatment directions. Please give me a call so I know what I have and I know what to do. My number is [phone]. Thank you.

## 2018-01-26 NOTE — Telephone Encounter (Signed)
It does shoe fungus. She would be a good candidate for the laser if she wanted to proceed. Also continue the topical.   Also, this was not resulted to me so sorry for not getting it to you.   Thanks.

## 2018-01-27 ENCOUNTER — Telehealth: Payer: Self-pay | Admitting: Podiatry

## 2018-01-27 ENCOUNTER — Ambulatory Visit: Payer: Medicare HMO | Admitting: Obstetrics and Gynecology

## 2018-01-27 NOTE — Telephone Encounter (Signed)
Left vm

## 2018-02-03 ENCOUNTER — Ambulatory Visit: Payer: Self-pay | Admitting: Podiatry

## 2018-02-03 DIAGNOSIS — B351 Tinea unguium: Secondary | ICD-10-CM

## 2018-02-06 NOTE — Progress Notes (Signed)
Pt presents with mycotic infection of nails 1-5 bilateral.  All other systems are negative  Laser therapy administered to affected nails and tolerated well. All safety precautions were in place.  2nd treatment.  Follow up in 4 weeks     

## 2018-02-07 ENCOUNTER — Other Ambulatory Visit (HOSPITAL_COMMUNITY)
Admission: RE | Admit: 2018-02-07 | Discharge: 2018-02-07 | Disposition: A | Discharge status: Home / Self Care | Payer: Medicare HMO | Source: Ambulatory Visit | Attending: Obstetrics and Gynecology | Admitting: Obstetrics and Gynecology

## 2018-02-07 ENCOUNTER — Encounter: Payer: Self-pay | Admitting: Certified Nurse Midwife

## 2018-02-07 ENCOUNTER — Other Ambulatory Visit: Payer: Self-pay

## 2018-02-07 ENCOUNTER — Ambulatory Visit (INDEPENDENT_AMBULATORY_CARE_PROVIDER_SITE_OTHER): Payer: Medicare HMO | Admitting: Certified Nurse Midwife

## 2018-02-07 VITALS — BP 114/70 | HR 70 | Resp 16 | Ht 63.25 in | Wt 154.0 lb

## 2018-02-07 DIAGNOSIS — N952 Postmenopausal atrophic vaginitis: Secondary | ICD-10-CM | POA: Diagnosis not present

## 2018-02-07 DIAGNOSIS — Z124 Encounter for screening for malignant neoplasm of cervix: Secondary | ICD-10-CM

## 2018-02-07 DIAGNOSIS — Z01419 Encounter for gynecological examination (general) (routine) without abnormal findings: Secondary | ICD-10-CM

## 2018-02-07 NOTE — Progress Notes (Signed)
67 y.o. G1P0010 Married  Caucasian Fe here for annual exam. Menopausal no HRT, denies vaginal bleeding. 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 Has update RX.Occasional vaginal dryness, not using any moisture for. Last pelvic exam very uncomfortable due to dryness Sees Dr. Forde Dandy for aex, labs and medication management of Synthroid, labs. No other health issues.  Patient's last menstrual period was 05/17/1998.          Sexually active: Yes.    The current method of family planning is vasectomy.    Exercising: Yes.    cardio & weights Smoker:  no  Review of Systems  Constitutional: Negative.   HENT: Negative.   Eyes: Negative.   Respiratory: Negative.   Cardiovascular: Negative.   Gastrointestinal: Negative.   Genitourinary: Negative.   Musculoskeletal: Negative.   Skin: Negative.   Neurological: Negative.   Endo/Heme/Allergies: Negative.   Psychiatric/Behavioral: Negative.     Health Maintenance: Pap:  09-24-15 neg History of Abnormal Pap: no MMG:  01-25-17 category b density birads 1:neg Breast center Self Breast exams: no Colonoscopy:  2014 f/u 49yrs normal BMD:   2017 osteopenia TDaP:  2010 Shingles: 2019 Pneumonia: 2018 Hep C and HIV: not done Labs: if needed   reports that she has never smoked. She has never used smokeless tobacco. She reports that she drinks about 5.0 standard drinks of alcohol per week. She reports that she does not use drugs.  Past Medical History:  Diagnosis Date  . Cancer [facility]) 2008   thyroid  . Endometrial polyp 2/15   benign  . IBS (irritable bowel syndrome)   . Seasonal allergies   . Thyroid disease 2008   hx thyroid cancer--bilateral nodules    Past Surgical History:  Procedure Laterality Date  . COLONOSCOPY     12 yrs ago-normal  . DILATATION & CURRETTAGE/HYSTEROSCOPY WITH RESECTOCOPE N/A 07/03/2013   Procedure: DILATATION & CURETTAGE/HYSTEROSCOPY WITH RESECTOCOPE;  Surgeon: Jamey Reas de Berton Lan, MD;   Location: [facility];  Service: Gynecology;  Laterality: N/A;  . DILATION AND CURETTAGE OF UTERUS    . SHOULDER SURGERY Left   . TOTAL THYROIDECTOMY  2008  . vocal cord implant Right 2009    Current Outpatient Medications  Medication Sig Dispense Refill  . fexofenadine (ALLEGRA) 180 MG tablet Take 180 mg by mouth daily.    Marland Kitchen levothyroxine (SYNTHROID, LEVOTHROID) 125 MCG tablet Take 125 mcg by mouth daily before breakfast.    . Magnesium 100 MG CAPS Take 1 capsule by mouth daily.    . Multiple Vitamin (STRESSTABS PO) Take 1 tablet by mouth daily.    Salley Scarlet [facility]  Onychomycosis Nail Lacquer -  Fluconazole 2%, Terbinafine 1% DMSO Apply to affected nail once daily Qty. 120 gm 3 refills    . Probiotic Product (PROBIOTIC DAILY PO) Take 1 capsule by mouth daily.    . TURMERIC PO Take by mouth.    Marland Kitchen UNABLE TO FIND Vitamin d po 3 times weekly    . valACYclovir (VALTREX) 500 MG tablet as needed.  1   No current facility-administered medications for this visit.     Family History  Problem Relation Age of Onset  . Thyroid disease Mother   . Other Father        pacemaker  . Hypertension Paternal Grandmother   . Hypertension Paternal Grandfather   . Colon cancer Neg Hx     ROS:  Pertinent items are noted in HPI.  Otherwise, a comprehensive ROS was negative.  Exam:   BP 114/70   Pulse 70   Resp 16   Ht 5' 3.25" (1.607 m)   Wt 154 lb (69.9 kg)   LMP 05/17/1998 Comment: pt had bleeding from polyp 2/15  BMI 27.06 kg/m  Height: 5' 3.25" (160.7 cm) Ht Readings from Last 3 Encounters:  02/07/18 5' 3.25" (1.607 m)  01/14/17 5' 3.25" (1.607 m)  09/24/15 5' 3.5" (1.613 m)    General appearance: alert, cooperative and appears stated age Head: Normocephalic, without obvious abnormality, atraumatic Neck: no adenopathy, supple, symmetrical, trachea midline and thyroid normal to inspection and palpation Lungs: clear to auscultation bilaterally Breasts: normal appearance,  no masses or tenderness, No nipple retraction or dimpling, No nipple discharge or bleeding, No axillary or supraclavicular adenopathy Heart: regular rate and rhythm Abdomen: soft, non-tender; no masses,  no organomegaly Extremities: extremities normal, atraumatic, no cyanosis or edema Skin: Skin color, texture, turgor normal. No rashes or lesions Lymph nodes: Cervical, supraclavicular, and axillary nodes normal. No abnormal inguinal nodes palpated Neurologic: Grossly normal   Pelvic: External genitalia:  no lesions, normal female              Urethra:  normal appearing urethra with no masses, tenderness or lesions              Bartholin's and Skene's: normal                 Vagina: normal appearing vagina with normal color and discharge, no lesions              Cervix: no cervical motion tenderness, no lesions and normal appearance              Pap taken: Yes.   Bimanual Exam:  Uterus:  normal size, contour, position, consistency, mobility, non-tender and anteverted              Adnexa: normal adnexa and no mass, fullness, tenderness               Rectovaginal: Confirms               Anus:  normal sphincter tone, no lesions  Chaperone present: yes  A:  Well Woman with normal exam  Menopausal no HRT  Atrophic vaginitis   Hypothyroid with PCP management  History of oral HSV 1, has updated RX  Osteopenia, BMD due next year    P:   Reviewed health and wellness pertinent to exam  Discussed need to call if vaginal bleeding  Discussed atrophic vaginitis(vaginal dryness) finding and treatment with coconut or olive oil for comfort and decrease risk of UTI and discomfort. Information given. Questions addressed.  Continue follow up with PCP as indicated.  Patient will call if needs update.  Stressed importance of calcium in diet to bone health.  Pap smear: yes   counseled on breast self exam, mammography screening, menopause, adequate intake of calcium and vitamin D, diet and  exercise  return annually or prn  An After Visit Summary was printed and given to the patient.

## 2018-02-07 NOTE — Patient Instructions (Signed)

## 2018-02-08 LAB — CYTOLOGY - PAP
Diagnosis: NEGATIVE
HPV (WINDOPATH): NOT DETECTED

## 2018-02-14 ENCOUNTER — Other Ambulatory Visit: Payer: Self-pay | Admitting: Obstetrics and Gynecology

## 2018-02-14 DIAGNOSIS — Z1231 Encounter for screening mammogram for malignant neoplasm of breast: Secondary | ICD-10-CM

## 2018-03-03 ENCOUNTER — Ambulatory Visit: Payer: Self-pay

## 2018-03-03 DIAGNOSIS — B351 Tinea unguium: Secondary | ICD-10-CM

## 2018-03-10 NOTE — Progress Notes (Signed)
Pt presents with mycotic infection of nails 1-5 bilateral.  All other systems are negative  Laser therapy administered to affected nails and tolerated well. All safety precautions were in place.  3rd treatment.  Follow up in 4 weeks     

## 2018-03-16 ENCOUNTER — Encounter: Payer: Self-pay | Admitting: Podiatry

## 2018-03-16 ENCOUNTER — Ambulatory Visit: Payer: Self-pay

## 2018-03-16 ENCOUNTER — Ambulatory Visit: Payer: Medicare HMO | Admitting: Podiatry

## 2018-03-16 DIAGNOSIS — B351 Tinea unguium: Secondary | ICD-10-CM | POA: Diagnosis not present

## 2018-03-16 DIAGNOSIS — M21619 Bunion of unspecified foot: Secondary | ICD-10-CM

## 2018-03-16 DIAGNOSIS — R21 Rash and other nonspecific skin eruption: Secondary | ICD-10-CM

## 2018-03-16 NOTE — Patient Instructions (Signed)

## 2018-03-17 ENCOUNTER — Ambulatory Visit
Admission: RE | Admit: 2018-03-17 | Discharge: 2018-03-17 | Disposition: A | Discharge status: Home / Self Care | Payer: Medicare HMO | Source: Ambulatory Visit | Attending: Obstetrics and Gynecology | Admitting: Obstetrics and Gynecology

## 2018-03-17 DIAGNOSIS — Z1231 Encounter for screening mammogram for malignant neoplasm of breast: Secondary | ICD-10-CM

## 2018-03-21 NOTE — Progress Notes (Signed)
Subjective: 67 year old female presents the office today for concerns of her left big toenail becoming somewhat painful and she states that she had redness to her toe.  This happened 3 days ago and it did subside last night but she is to have the area checked although her symptoms are much improved.  She has been getting laser therapy for the toenail she is been using a compound cream.  She is concerned that she may have had a reaction of the compound ointment. Denies any systemic complaints such as fevers, chills, nausea, vomiting. No acute changes since last appointment, and no other complaints at this time.   Objective: AAO x3, NAD DP/PT pulses palpable bilaterally, CRT less than 3 seconds The toenail itself is loose and underlying nail bed distally deformity and adherend along the proximal border.  He also has improvement in color.  There is some faint localized erythema on the toenail itself and seems that she is getting a reaction to there is no drainage of pus or increased warmth there is no swelling of the toe.  There is no fluctuation or crepitation or any other signs of infection. No open lesions or pre-ulcerative lesions.  No pain with calf compression, swelling, warmth, erythema  Assessment: Onychomycosis with skin reaction likely from compound ointment  Plan: -All treatment options discussed with the patient including all alternatives, risks, complications.  -He does not appear that the toenails that was clinically infected but I debrided today there is no drainage or pus.  We will hold off on a compound ointment for now.  Continue laser therapy.  We can restart the compound once results to see as she may just gotten too much around the skin.  If she continued irritation we will switch the medication. -Patient encouraged to call the office with any questions, concerns, change in symptoms.   Trula Slade DPM

## 2018-03-31 ENCOUNTER — Ambulatory Visit: Payer: Self-pay

## 2018-03-31 DIAGNOSIS — B351 Tinea unguium: Secondary | ICD-10-CM

## 2018-03-31 NOTE — Progress Notes (Signed)
Pt presents with mycotic infection of nails 1-5 bilateral  All other systems are negative  Laser therapy administered to affected nails and tolerated well. All safety precautions were in place. 4th treatment.  Follow up in 4 weeks     

## 2018-04-17 DIAGNOSIS — Z0000 Encounter for general adult medical examination without abnormal findings: Secondary | ICD-10-CM | POA: Diagnosis not present

## 2018-04-17 DIAGNOSIS — M859 Disorder of bone density and structure, unspecified: Secondary | ICD-10-CM | POA: Diagnosis not present

## 2018-04-17 DIAGNOSIS — C73 Malignant neoplasm of thyroid gland: Secondary | ICD-10-CM | POA: Diagnosis not present

## 2018-04-17 DIAGNOSIS — R82998 Other abnormal findings in urine: Secondary | ICD-10-CM | POA: Diagnosis not present

## 2018-04-24 DIAGNOSIS — N950 Postmenopausal bleeding: Secondary | ICD-10-CM | POA: Diagnosis not present

## 2018-04-24 DIAGNOSIS — E890 Postprocedural hypothyroidism: Secondary | ICD-10-CM | POA: Diagnosis not present

## 2018-04-24 DIAGNOSIS — Z1389 Encounter for screening for other disorder: Secondary | ICD-10-CM | POA: Diagnosis not present

## 2018-04-24 DIAGNOSIS — E7849 Other hyperlipidemia: Secondary | ICD-10-CM | POA: Diagnosis not present

## 2018-04-24 DIAGNOSIS — M859 Disorder of bone density and structure, unspecified: Secondary | ICD-10-CM | POA: Diagnosis not present

## 2018-04-24 DIAGNOSIS — C73 Malignant neoplasm of thyroid gland: Secondary | ICD-10-CM | POA: Diagnosis not present

## 2018-04-24 DIAGNOSIS — N951 Menopausal and female climacteric states: Secondary | ICD-10-CM | POA: Diagnosis not present

## 2018-04-28 ENCOUNTER — Ambulatory Visit: Payer: Self-pay

## 2018-04-28 DIAGNOSIS — B351 Tinea unguium: Secondary | ICD-10-CM

## 2018-05-01 NOTE — Progress Notes (Signed)
Pt presents with mycotic infection of nails 1-5 bilateral All other systems are negative  Laser therapy administered to affected nails and tolerated well. All safety precautions were in place. 5th treatment.  Follow up in 4 weeks     

## 2018-05-08 DIAGNOSIS — H9191 Unspecified hearing loss, right ear: Secondary | ICD-10-CM | POA: Diagnosis not present

## 2018-05-08 DIAGNOSIS — H6121 Impacted cerumen, right ear: Secondary | ICD-10-CM | POA: Diagnosis not present

## 2018-05-08 DIAGNOSIS — Z6827 Body mass index (BMI) 27.0-27.9, adult: Secondary | ICD-10-CM | POA: Diagnosis not present

## 2018-05-26 ENCOUNTER — Ambulatory Visit: Payer: Self-pay

## 2018-05-26 DIAGNOSIS — B351 Tinea unguium: Secondary | ICD-10-CM

## 2018-06-05 NOTE — Progress Notes (Signed)
Pt presents with mycotic infection of nails 1-5 bilateral  All other systems are negative  Laser therapy administered to affected nails and tolerated well. All safety precautions were in place.  6th treatment.  Follow up in 4 weeks     

## 2018-06-22 ENCOUNTER — Ambulatory Visit: Payer: Self-pay

## 2018-06-22 DIAGNOSIS — B351 Tinea unguium: Secondary | ICD-10-CM

## 2018-06-26 NOTE — Progress Notes (Signed)
Pt presents with mycotic infection of nails 1-5 bilateral  All other systems are negative  Laser therapy administered to affected nails and tolerated well. All safety precautions were in place follow-up PRN.

## 2018-08-22 ENCOUNTER — Other Ambulatory Visit: Payer: Medicare HMO

## 2018-09-26 ENCOUNTER — Other Ambulatory Visit: Payer: Self-pay

## 2018-09-26 ENCOUNTER — Ambulatory Visit: Payer: Medicare HMO

## 2018-09-26 DIAGNOSIS — B351 Tinea unguium: Secondary | ICD-10-CM

## 2018-09-28 NOTE — Progress Notes (Signed)
Pt presents with mycotic infection of nails 1-5 bilateral  All other systems are negative  Laser therapy administered to affected nails and tolerated well. All safety precautions were in place follow-up next month for 2nd treatment today with 1 more treatment with the new laser since patient has gone through a full course of 6 treatments of the Q-clear laser.

## 2018-10-19 ENCOUNTER — Ambulatory Visit: Payer: Medicare HMO

## 2018-10-20 ENCOUNTER — Other Ambulatory Visit: Payer: Self-pay

## 2018-10-20 ENCOUNTER — Ambulatory Visit (INDEPENDENT_AMBULATORY_CARE_PROVIDER_SITE_OTHER): Payer: Self-pay | Admitting: Podiatry

## 2018-10-20 VITALS — Temp 97.5°F

## 2018-10-20 DIAGNOSIS — B351 Tinea unguium: Secondary | ICD-10-CM

## 2018-10-20 NOTE — Progress Notes (Signed)
Patient presents with mycotic nail infection of the hallux left.  All other systems are negative  Laser therapy administered to the hallux nail and also to the other nails on the left foot. Patient tolerated procedure well. All safety precautions were in place   Patient has completed a full course of 6 treatments with the Q-clear laser.  Follow up in 4 weeks for the 3rd and final treatment with the [place] laser.

## 2018-11-15 ENCOUNTER — Other Ambulatory Visit: Payer: Self-pay

## 2018-11-15 ENCOUNTER — Ambulatory Visit: Payer: Medicare HMO | Admitting: Podiatry

## 2018-11-15 ENCOUNTER — Ambulatory Visit: Payer: Medicare HMO

## 2018-11-15 DIAGNOSIS — B351 Tinea unguium: Secondary | ICD-10-CM

## 2018-11-15 DIAGNOSIS — L821 Other seborrheic keratosis: Secondary | ICD-10-CM | POA: Diagnosis not present

## 2018-11-15 DIAGNOSIS — L738 Other specified follicular disorders: Secondary | ICD-10-CM | POA: Diagnosis not present

## 2018-11-15 DIAGNOSIS — M71342 Other bursal cyst, left hand: Secondary | ICD-10-CM | POA: Diagnosis not present

## 2018-11-15 DIAGNOSIS — L218 Other seborrheic dermatitis: Secondary | ICD-10-CM | POA: Diagnosis not present

## 2018-11-21 NOTE — Progress Notes (Signed)
Pt presents with mycotic infection of nails 1 through 5 bilateral.  All other systems are negative  Laser therapy administered to affected nails and tolerated well. All safety precautions were in place.  Fifth treatment.  Follow up in 4 weeks

## 2019-01-16 ENCOUNTER — Ambulatory Visit: Payer: Medicare HMO

## 2019-02-01 ENCOUNTER — Other Ambulatory Visit: Payer: Self-pay | Admitting: Obstetrics and Gynecology

## 2019-02-01 DIAGNOSIS — Z1231 Encounter for screening mammogram for malignant neoplasm of breast: Secondary | ICD-10-CM

## 2019-02-02 ENCOUNTER — Other Ambulatory Visit: Payer: Self-pay

## 2019-02-02 ENCOUNTER — Ambulatory Visit: Payer: Medicare HMO | Admitting: Podiatry

## 2019-02-02 DIAGNOSIS — B351 Tinea unguium: Secondary | ICD-10-CM

## 2019-02-09 ENCOUNTER — Ambulatory Visit: Payer: Medicare HMO | Admitting: Certified Nurse Midwife

## 2019-02-20 NOTE — Progress Notes (Signed)
Pt presents with mycotic infection of nails 1-5 bilateral  All other systems are negative  Laser therapy administered to affected nails and tolerated well. All safety precautions were in place follow-up next month for additional treatment

## 2019-03-02 ENCOUNTER — Other Ambulatory Visit: Payer: Medicare HMO

## 2019-03-09 ENCOUNTER — Ambulatory Visit: Payer: Medicare HMO

## 2019-03-09 ENCOUNTER — Other Ambulatory Visit: Payer: Self-pay

## 2019-03-09 DIAGNOSIS — B351 Tinea unguium: Secondary | ICD-10-CM

## 2019-03-12 NOTE — Progress Notes (Signed)
Pt presents with mycotic infection of nails 1-5 bilateral  All other systems are negative  Laser therapy administered to affected nails and tolerated well. All safety precautions were in place follow-up next month for additional treatment

## 2019-03-20 ENCOUNTER — Ambulatory Visit: Payer: Medicare HMO

## 2019-04-06 DIAGNOSIS — M79645 Pain in left finger(s): Secondary | ICD-10-CM | POA: Diagnosis not present

## 2019-04-20 ENCOUNTER — Ambulatory Visit (INDEPENDENT_AMBULATORY_CARE_PROVIDER_SITE_OTHER): Payer: Medicare HMO | Admitting: *Deleted

## 2019-04-20 ENCOUNTER — Other Ambulatory Visit: Payer: Self-pay

## 2019-04-20 DIAGNOSIS — B351 Tinea unguium: Secondary | ICD-10-CM

## 2019-04-20 NOTE — Progress Notes (Signed)
Patient presents today for the 8th laser treatment. Diagnosed with mycotic nail infection by Dr. Jacqualyn Posey. Toenail most affected is the left great nail, medial corner. She states that there has been no real improvement in the nail and she is frustrated.  All other systems are negative.  Nails were filed thin. Laser therapy was administered to 1st toenail left and patient tolerated the treatment well. All safety precautions were in place.   I recommended she try a topical antifungal, Formula 7, and hold off on the laser for 2 months to allow the toenail to grow.  Follow up in 2 months for laser # 9.

## 2019-04-23 DIAGNOSIS — C73 Malignant neoplasm of thyroid gland: Secondary | ICD-10-CM | POA: Diagnosis not present

## 2019-04-23 DIAGNOSIS — E7849 Other hyperlipidemia: Secondary | ICD-10-CM | POA: Diagnosis not present

## 2019-04-27 DIAGNOSIS — R82998 Other abnormal findings in urine: Secondary | ICD-10-CM | POA: Diagnosis not present

## 2019-04-30 DIAGNOSIS — Z0000 Encounter for general adult medical examination without abnormal findings: Secondary | ICD-10-CM | POA: Diagnosis not present

## 2019-04-30 DIAGNOSIS — E785 Hyperlipidemia, unspecified: Secondary | ICD-10-CM | POA: Diagnosis not present

## 2019-04-30 DIAGNOSIS — Z1331 Encounter for screening for depression: Secondary | ICD-10-CM | POA: Diagnosis not present

## 2019-04-30 DIAGNOSIS — E890 Postprocedural hypothyroidism: Secondary | ICD-10-CM | POA: Diagnosis not present

## 2019-04-30 DIAGNOSIS — M8580 Other specified disorders of bone density and structure, unspecified site: Secondary | ICD-10-CM | POA: Diagnosis not present

## 2019-04-30 DIAGNOSIS — D126 Benign neoplasm of colon, unspecified: Secondary | ICD-10-CM | POA: Diagnosis not present

## 2019-04-30 DIAGNOSIS — L80 Vitiligo: Secondary | ICD-10-CM | POA: Diagnosis not present

## 2019-04-30 DIAGNOSIS — Z1339 Encounter for screening examination for other mental health and behavioral disorders: Secondary | ICD-10-CM | POA: Diagnosis not present

## 2019-04-30 DIAGNOSIS — C73 Malignant neoplasm of thyroid gland: Secondary | ICD-10-CM | POA: Diagnosis not present

## 2019-04-30 DIAGNOSIS — N6019 Diffuse cystic mastopathy of unspecified breast: Secondary | ICD-10-CM | POA: Diagnosis not present

## 2019-05-07 ENCOUNTER — Ambulatory Visit: Payer: Medicare HMO

## 2019-05-09 DIAGNOSIS — Z1212 Encounter for screening for malignant neoplasm of rectum: Secondary | ICD-10-CM | POA: Diagnosis not present

## 2019-05-10 DIAGNOSIS — Z85850 Personal history of malignant neoplasm of thyroid: Secondary | ICD-10-CM | POA: Diagnosis not present

## 2019-05-10 DIAGNOSIS — J309 Allergic rhinitis, unspecified: Secondary | ICD-10-CM | POA: Diagnosis not present

## 2019-05-10 DIAGNOSIS — E039 Hypothyroidism, unspecified: Secondary | ICD-10-CM | POA: Diagnosis not present

## 2019-05-10 DIAGNOSIS — Z8249 Family history of ischemic heart disease and other diseases of the circulatory system: Secondary | ICD-10-CM | POA: Diagnosis not present

## 2019-05-10 DIAGNOSIS — L80 Vitiligo: Secondary | ICD-10-CM | POA: Diagnosis not present

## 2019-05-28 DIAGNOSIS — M19042 Primary osteoarthritis, left hand: Secondary | ICD-10-CM | POA: Diagnosis not present

## 2019-05-28 DIAGNOSIS — M6740 Ganglion, unspecified site: Secondary | ICD-10-CM | POA: Diagnosis not present

## 2019-06-06 ENCOUNTER — Ambulatory Visit: Payer: Medicare Other | Attending: Endocrinology

## 2019-06-06 DIAGNOSIS — Z23 Encounter for immunization: Secondary | ICD-10-CM | POA: Insufficient documentation

## 2019-06-06 NOTE — Progress Notes (Signed)
   Covid-19 Vaccination Clinic  Name:  Kelly Ingram [facility]    MRN: [id number] DOB: 01/18/1951  06/06/2019  Kelly Ingram was observed post Covid-19 immunization for 15 minutes without incidence. She was provided with Vaccine Information Sheet and instruction to access the V-Safe system.   Kelly Ingram was instructed to call 911 with any severe reactions post vaccine: Marland Kitchen Difficulty breathing  . Swelling of your face and throat  . A fast heartbeat  . A bad rash all over your body  . Dizziness and weakness    Immunizations Administered    Name Date Dose VIS Date Route   Pfizer COVID-19 Vaccine 06/06/2019 10:06 AM 0.3 mL 04/27/2019 Intramuscular   Manufacturer: [place]   Lot: [id number]   [place]: [id number]

## 2019-06-14 DIAGNOSIS — K219 Gastro-esophageal reflux disease without esophagitis: Secondary | ICD-10-CM | POA: Diagnosis not present

## 2019-06-15 ENCOUNTER — Other Ambulatory Visit: Payer: Self-pay

## 2019-06-15 ENCOUNTER — Ambulatory Visit (INDEPENDENT_AMBULATORY_CARE_PROVIDER_SITE_OTHER): Payer: Medicare HMO | Admitting: *Deleted

## 2019-06-15 DIAGNOSIS — B351 Tinea unguium: Secondary | ICD-10-CM

## 2019-06-15 NOTE — Progress Notes (Signed)
Patient presents today for the 9th laser treatment. Diagnosed with mycotic nail infection by Dr. Jacqualyn Posey. Toenail most affected is the left great nail, medial corner. Patient states her nail really hasn't improved, even with using a topical as well.  All other systems are negative.  Nail was filed thin.   Laser therapy was NOT administered today.   Follow up as needed.

## 2019-06-18 ENCOUNTER — Other Ambulatory Visit: Payer: Self-pay

## 2019-06-18 ENCOUNTER — Ambulatory Visit
Admission: RE | Admit: 2019-06-18 | Discharge: 2019-06-18 | Disposition: A | Discharge status: Home / Self Care | Payer: Medicare Other | Source: Ambulatory Visit | Attending: Obstetrics and Gynecology | Admitting: Obstetrics and Gynecology

## 2019-06-18 DIAGNOSIS — Z1231 Encounter for screening mammogram for malignant neoplasm of breast: Secondary | ICD-10-CM | POA: Diagnosis not present

## 2019-06-25 ENCOUNTER — Ambulatory Visit: Payer: Medicare HMO | Attending: Internal Medicine

## 2019-06-25 DIAGNOSIS — Z23 Encounter for immunization: Secondary | ICD-10-CM | POA: Insufficient documentation

## 2019-06-25 NOTE — Progress Notes (Signed)
   Covid-19 Vaccination Clinic  Name:  Kelly Ingram    MRN: [id number] DOB: [birth year]  06/25/2019  [name] was observed post Covid-19 immunization for 15 minutes without incidence. She was provided with Vaccine Information Sheet and instruction to access the V-Safe system.   [name] was instructed to call 911 with any severe reactions post vaccine: Marland Kitchen Difficulty breathing  . Swelling of your face and throat  . A fast heartbeat  . A bad rash all over your body  . Dizziness and weakness    Immunizations Administered    Name Date Dose VIS Date Route   Pfizer COVID-19 Vaccine 06/25/2019  1:47 PM 0.3 mL 04/27/2019 Intramuscular   Manufacturer: [place]   Lot: [id number]   [place]: [id number]

## 2019-08-07 ENCOUNTER — Encounter: Payer: Self-pay | Admitting: Certified Nurse Midwife

## 2019-09-11 DIAGNOSIS — M7090 Unspecified soft tissue disorder related to use, overuse and pressure of unspecified site: Secondary | ICD-10-CM | POA: Diagnosis not present

## 2019-09-11 DIAGNOSIS — M79605 Pain in left leg: Secondary | ICD-10-CM | POA: Diagnosis not present

## 2019-09-19 DIAGNOSIS — H5213 Myopia, bilateral: Secondary | ICD-10-CM | POA: Diagnosis not present

## 2019-09-19 DIAGNOSIS — R69 Illness, unspecified: Secondary | ICD-10-CM | POA: Diagnosis not present

## 2020-01-24 DIAGNOSIS — E890 Postprocedural hypothyroidism: Secondary | ICD-10-CM | POA: Diagnosis not present

## 2020-01-24 DIAGNOSIS — J45909 Unspecified asthma, uncomplicated: Secondary | ICD-10-CM | POA: Diagnosis not present

## 2020-01-24 DIAGNOSIS — E785 Hyperlipidemia, unspecified: Secondary | ICD-10-CM | POA: Diagnosis not present

## 2020-01-24 DIAGNOSIS — R251 Tremor, unspecified: Secondary | ICD-10-CM | POA: Diagnosis not present

## 2020-01-24 DIAGNOSIS — D126 Benign neoplasm of colon, unspecified: Secondary | ICD-10-CM | POA: Diagnosis not present

## 2020-01-24 DIAGNOSIS — K219 Gastro-esophageal reflux disease without esophagitis: Secondary | ICD-10-CM | POA: Diagnosis not present

## 2020-01-24 DIAGNOSIS — Z789 Other specified health status: Secondary | ICD-10-CM | POA: Diagnosis not present

## 2020-01-24 DIAGNOSIS — C73 Malignant neoplasm of thyroid gland: Secondary | ICD-10-CM | POA: Diagnosis not present

## 2020-01-24 DIAGNOSIS — M8580 Other specified disorders of bone density and structure, unspecified site: Secondary | ICD-10-CM | POA: Diagnosis not present

## 2020-01-24 DIAGNOSIS — L80 Vitiligo: Secondary | ICD-10-CM | POA: Diagnosis not present

## 2020-01-31 NOTE — Progress Notes (Signed)
Assessment/Plan:   1. Tremor  -Very little tremor noted on examination today.  Reassurance provided.  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  Her dose of Synthroid was recently adjusted about 2 weeks ago and I wonder if this is not part of her tremor recently.  I would recommend holding on any further medication for now.  She does not want any medication either.  -I did tell her that if tremor/tremulous sensation persists following lowering of Synthroid, then I am happy to see her back and reevaluate her.  I did tell her that technically, for the diagnosis of essential tremor, one needs to have the symptoms for at least 3 years.  I certainly does not mean that we could not treat her before that time if necessary.  -Patient's neuro exam was nonfocal and nonlateralizing today.  Subjective:   Kelly Ingram was seen today in the movement disorders clinic for neurologic consultation at the request of Reynold Bowen, MD.  The consultation is for the evaluation of intention tremor.  Medical records made available to me have been reviewed.  Records indicate that tremor has been going on 5 to 6 months.    Tremor: Yes.     How long has it been going on? 5-6 months  At rest or with activation?  activation  When is it noted the most?  Using mouse; writing  Fam hx of tremor?  Yes.  , Half brother has tremor on fathers side.  Maternal grandmother with Parkinson's.  Located where?  L>R hand - started bilateral but L is worse than the right; wonders if tongue moving in the mouth as occasionally feels tongue hit teeth.  She is R hand dominant  Affected by caffeine:  Doesn't drink much/any  Affected by alcohol:  No. (drinks 3 drinks per week)  Affected by stress:  Yes.    Affected by fatigue:  No.  Spills soup if on spoon:  No.  Spills glass of liquid if full:  No.  Affects ADL's (tying shoes, brushing teeth, etc):   No.  Tremor inducing meds:  No. - no hx of reglan or antidepressants  Other Specific Symptoms:  Voice: no change Sleep: sleeps well usually  Vivid Dreams:  No.  Acting out dreams:  No. Wet Pillows: No. Postural symptoms:  No.  Falls?  No. Bradykinesia symptoms: no bradykinesia noted (some mild trouble getting up due to L knee issues) Loss of smell:  No. Loss of taste:  No. Urinary Incontinence:  No. Difficulty Swallowing:  No. Handwriting, micrographia: No. Trouble with ADL's:  No.  Trouble buttoning clothing: No. Depression:  No. Memory changes:  No. Hallucinations:  No.  visual distortions: No. N/V:  No. Lightheaded:  No.  Syncope: No. Diplopia:  No. Dyskinesia:  No.  Neuroimaging of the brain has not previously been performed.   PREVIOUS MEDICATIONS: none to date  ALLERGIES:   Allergies  Allergen Reactions  . Effexor [Venlafaxine] Other (See Comments)    Cognitive change, tremors. - July 30, 2013.  Marland Kitchen Erythromycin Nausea Only    GI upset.  . Adhesive [Tape]     CURRENT MEDICATIONS:  Current Outpatient Medications  Medication Instructions  . Cholecalciferol (VITAMIN D3) 10 MCG (400 UNIT) CAPS 1 capsule, Oral, Daily  . fexofenadine (ALLEGRA) 180 mg, Daily  . hydrocortisone (ANUSOL-HC) 2.5 % rectal cream 1 application, Rectal, As needed  . levothyroxine (SYNTHROID) 125 mcg, Oral, Daily before breakfast,  One tablet daily 6 days a week  . Magnesium 100 MG CAPS 1 capsule, Oral, Daily  . Misc Natural Products (JOINT HEALTH PO) 2 tablets, Oral, Daily  . Multiple Vitamin (STRESSTABS PO) 1 tablet, Oral, Daily  . Probiotic Product (PROBIOTIC DAILY PO) 1 capsule, Daily  . TURMERIC PO 1 tablet, Oral, Daily  . valACYclovir (VALTREX) 500 MG tablet As needed    Objective:   PHYSICAL EXAMINATION:    VITALS:   Vitals:   02/07/20 0840  BP: 130/79  Pulse: 66  SpO2: 98%  Weight: 155 lb (70.3 kg)  Height: 5\' 4"  (1.626 m)    GEN:  The patient appears stated age and  is in NAD. HEENT:  Normocephalic, atraumatic.  The mucous membranes are moist. The superficial temporal arteries are without ropiness or tenderness. CV:  RRR Lungs:  CTAB Neck/HEME:  There are no carotid bruits bilaterally.  Neurological examination:  Orientation: The patient is alert and oriented x3.  Cranial nerves: There is good facial symmetry.  Extraocular muscles are intact. The visual fields are full to confrontational testing. The speech is fluent and clear. Soft palate rises symmetrically and there is no tongue deviation. Hearing is intact to conversational tone. Sensation: Sensation is intact to light touch throughout (facial, trunk, extremities). Vibration is intact at the bilateral big toe. There is no extinction with double simultaneous stimulation.  Motor: Strength is 5/5 in the bilateral upper and lower extremities.   Shoulder shrug is equal and symmetric.  There is no pronator drift. Deep tendon reflexes: Deep tendon reflexes are 2-/4 at the bilateral biceps, triceps, brachioradialis, patella and achilles. Plantar responses are downgoing bilaterally.  Movement examination: Tone: There is normal tone in the bilateral upper extremities.  The tone in the lower extremities is normal.  Abnormal movements: No rest tremor, even with distraction procedures.  No postural tremor.  No significant intention tremor.  She has very little tremor with Archimedes spirals.  She successfully pours water from one glass to another without spilling it.  No facial tremor.  No tremor when she is given a weight to hold. Coordination:  There is no decremation with RAM's, with any form of RAMS, including alternating supination and pronation of the forearm, hand opening and closing, finger taps, heel taps and toe taps. Gait and Station: The patient has no difficulty arising out of a deep-seated chair without the use of the hands. The patient's stride length is good.  She is able to ambulate in a tandem fashion  even in her heels without difficulty.  I have reviewed and interpreted the following labs independently Lab work was done on January 27, 2020.  Sodium was 137, potassium 4.8, chloride 100, CO2 22, BUN 14, creatinine 0.7, glucose 91, TSH was low at 0.05 (referring made note that she was over treated)  Total time spent on today's visit was 40 minutes, including both face-to-face time and nonface-to-face time.  Time included that spent on review of records (prior notes available to me/labs/imaging if pertinent), discussing treatment and goals, answering patient's questions and coordinating care.  Cc:  Reynold Bowen, MD

## 2020-02-07 ENCOUNTER — Other Ambulatory Visit: Payer: Self-pay

## 2020-02-07 ENCOUNTER — Ambulatory Visit: Payer: Medicare HMO | Admitting: Neurology

## 2020-02-07 ENCOUNTER — Encounter: Payer: Self-pay | Admitting: Neurology

## 2020-02-07 VITALS — BP 130/79 | HR 66 | Ht 64.0 in | Wt 155.0 lb

## 2020-02-07 DIAGNOSIS — R251 Tremor, unspecified: Secondary | ICD-10-CM

## 2020-02-07 NOTE — Patient Instructions (Signed)
Let us know if tremor gets worse once thyroid is under control.  If so, we can consider medications for it.  Right now, I would not recommend medications for tremor.  It was my pleasure to see you today!  The physicians and staff at [facility] are committed to providing excellent care. You may receive a survey requesting feedback about your experience at our office. We strive to receive "very good" responses to the survey questions. If you feel that your experience would prevent you from giving the office a "very good " response, please contact our office to try to remedy the situation. We may be reached at [phone]. Thank you for taking the time out of your busy day to complete the survey.

## 2020-04-16 DIAGNOSIS — R69 Illness, unspecified: Secondary | ICD-10-CM | POA: Diagnosis not present

## 2020-04-21 NOTE — Progress Notes (Unsigned)
69 y.o. G1P0010 Married White or Caucasian female here for annual exam.      Patient's last menstrual period was 05/17/1998.          Sexually active: {yes no:314532}  The current method of family planning is vasectomy.    Exercising: {yes no:314532}  {types:19826} Smoker:  {YES NO:22349}  Health Maintenance: Pap:  09-24-15 neg, 02-07-18 neg HPV HR neg History of abnormal Pap:  no MMG:  06-19-2019 category b density birads 1:neg Colonoscopy:  2014 f/u 30yrs BMD:   2017 osteopenia TDaP:  2010 Gardasil:   *** Covid-19: pfizer done Pneumonia vaccine(s):  2018 Shingrix:   2019 Hep C testing: *** Screening Labs: ***   reports that she has never smoked. She has never used smokeless tobacco. She reports current alcohol use of about 5.0 standard drinks of alcohol per week. She reports that she does not use drugs.  Past Medical History:  Diagnosis Date  . Cancer [facility]) 2008   thyroid  . Endometrial polyp 2/15   benign  . IBS (irritable bowel syndrome)   . Seasonal allergies   . Thyroid disease 2008   hx thyroid cancer--bilateral nodules    Past Surgical History:  Procedure Laterality Date  . COLONOSCOPY     12 yrs ago-normal  . DILATATION & CURRETTAGE/HYSTEROSCOPY WITH RESECTOCOPE N/A 07/03/2013   Procedure: DILATATION & CURETTAGE/HYSTEROSCOPY WITH RESECTOCOPE;  Surgeon: Jamey Reas de Berton Lan, MD;  Location: [facility];  Service: Gynecology;  Laterality: N/A;  . DILATION AND CURETTAGE OF UTERUS    . SHOULDER SURGERY Left   . TOTAL THYROIDECTOMY  2008  . vocal cord implant Right 2009    Current Outpatient Medications  Medication Sig Dispense Refill  . Cholecalciferol (VITAMIN D3) 10 MCG (400 UNIT) CAPS Take 1 capsule by mouth daily.    . fexofenadine (ALLEGRA) 180 MG tablet Take 180 mg by mouth daily.    . hydrocortisone (ANUSOL-HC) 2.5 % rectal cream Place 1 application rectally as needed.     Marland Kitchen levothyroxine (SYNTHROID, LEVOTHROID) 125 MCG tablet Take 125 mcg by  mouth daily before breakfast. One tablet daily 6 days a week    . Magnesium 100 MG CAPS Take 1 capsule by mouth daily.    . Misc Natural Products (JOINT HEALTH PO) Take 2 tablets by mouth daily.    . Multiple Vitamin (STRESSTABS PO) Take 1 tablet by mouth daily.    . Probiotic Product (PROBIOTIC DAILY PO) Take 1 capsule by mouth daily.    . TURMERIC PO Take 1 tablet by mouth daily.     . valACYclovir (VALTREX) 500 MG tablet as needed.  1   No current facility-administered medications for this visit.    Family History  Problem Relation Age of Onset  . Thyroid disease Mother   . Other Father        pacemaker  . Hypertension Paternal Grandmother   . Hypertension Paternal Grandfather   . Colon cancer Neg Hx     Review of Systems  Exam:   LMP 05/17/1998 Comment: pt had bleeding from polyp 2/15     General appearance: alert, cooperative and appears stated age Head: Normocephalic, without obvious abnormality, atraumatic Neck: no adenopathy, supple, symmetrical, trachea midline and thyroid {EXAM; THYROID:18604} Lungs: clear to auscultation bilaterally Breasts: {Exam; breast:13139::"normal appearance, no masses or tenderness"} Heart: regular rate and rhythm Abdomen: soft, non-tender; bowel sounds normal; no masses,  no organomegaly Extremities: extremities normal, atraumatic, no cyanosis or edema Skin: Skin color,  texture, turgor normal. No rashes or lesions Lymph nodes: Cervical, supraclavicular, and axillary nodes normal. No abnormal inguinal nodes palpated Neurologic: Grossly normal   Pelvic: External genitalia:  no lesions              Urethra:  normal appearing urethra with no masses, tenderness or lesions              Bartholins and Skenes: normal                 Vagina: normal appearing vagina with normal color and discharge, no lesions              Cervix: {exam; cervix:14595}              Pap taken: {yes no:314532} Bimanual Exam:  Uterus:  {exam; uterus:12215}               Adnexa: {exam; adnexa:12223}               Rectovaginal: Confirms               Anus:  normal sphincter tone, no lesions  ***, CMA Chaperone was present for exam.  A:  Well Woman with normal exam  P:   {plan; gyn:5269::"mammogram","pap smear","return annually or prn"}

## 2020-04-22 ENCOUNTER — Ambulatory Visit: Payer: Medicare HMO | Admitting: Nurse Practitioner

## 2020-04-22 DIAGNOSIS — Z1152 Encounter for screening for COVID-19: Secondary | ICD-10-CM | POA: Diagnosis not present

## 2020-04-22 DIAGNOSIS — B9789 Other viral agents as the cause of diseases classified elsewhere: Secondary | ICD-10-CM | POA: Diagnosis not present

## 2020-04-22 DIAGNOSIS — J988 Other specified respiratory disorders: Secondary | ICD-10-CM | POA: Diagnosis not present

## 2020-04-22 DIAGNOSIS — J45909 Unspecified asthma, uncomplicated: Secondary | ICD-10-CM | POA: Diagnosis not present

## 2020-05-02 NOTE — Progress Notes (Signed)
69 y.o. G1P0010 Married White or Caucasian female here for breast, pelvic.     Patient's last menstrual period was 05/17/1998.          Sexually active: Yes.    The current method of family planning is vasectomy and post menopausal status.    Exercising: Yes.    walking, qigong Smoker:  no  Health Maintenance: Pap:  02-07-18 neg HPV HR neg History of abnormal Pap:  no MMG:  07-07-2019 category b density birads 1:neg Colonoscopy:  2014 f/u 58yrs BMD:   2017 TDaP:  2010 Gardasil:   n/a Covid-19: pfizer Pneumonia vaccine(s):  Had done Shingrix:   2019 Hep C testing: not done Screening Labs: with PCP   reports that she has never smoked. She has never used smokeless tobacco. She reports current alcohol use. She reports that she does not use drugs.  Past Medical History:  Diagnosis Date  . Cancer [facility]) 2008   thyroid  . Endometrial polyp 2/15   benign  . IBS (irritable bowel syndrome)   . Seasonal allergies   . Thyroid disease 2008   hx thyroid cancer--bilateral nodules    Past Surgical History:  Procedure Laterality Date  . COLONOSCOPY     12 yrs ago-normal  . DILATATION & CURRETTAGE/HYSTEROSCOPY WITH RESECTOCOPE N/A 07/03/2013   Procedure: DILATATION & CURETTAGE/HYSTEROSCOPY WITH RESECTOCOPE;  Surgeon: Jamey Reas de Berton Lan, MD;  Location: [facility];  Service: Gynecology;  Laterality: N/A;  . DILATION AND CURETTAGE OF UTERUS    . SHOULDER SURGERY Left   . TOTAL THYROIDECTOMY  2008  . vocal cord implant Right 2009    Current Outpatient Medications  Medication Sig Dispense Refill  . Cholecalciferol (VITAMIN D3) 10 MCG (400 UNIT) CAPS Take 1 capsule by mouth daily.    . fexofenadine (ALLEGRA) 180 MG tablet Take 180 mg by mouth daily.    . hydrocortisone (ANUSOL-HC) 2.5 % rectal cream Place 1 application rectally as needed.     Marland Kitchen levothyroxine (SYNTHROID, LEVOTHROID) 125 MCG tablet Take 125 mcg by mouth daily before breakfast. One tablet daily 6 days a week     . Magnesium 100 MG CAPS Take 1 capsule by mouth daily.    . Misc Natural Products (JOINT HEALTH PO) Take 2 tablets by mouth daily.    . Multiple Vitamin (STRESSTABS PO) Take 1 tablet by mouth daily.    . Multiple Vitamins-Minerals (ZINC PO) Take by mouth.    . Probiotic Product (PROBIOTIC DAILY PO) Take 1 capsule by mouth daily.    . TURMERIC PO Take 1 tablet by mouth daily.     . valACYclovir (VALTREX) 500 MG tablet as needed.  1   No current facility-administered medications for this visit.    Family History  Problem Relation Age of Onset  . Thyroid disease Mother   . Other Father        pacemaker  . Hypertension Paternal Grandmother   . Hypertension Paternal Grandfather   . Colon cancer Neg Hx     Review of Systems  Constitutional: Negative.   HENT: Negative.   Eyes: Negative.   Respiratory: Negative.   Cardiovascular: Negative.   Gastrointestinal: Negative.   Endocrine: Negative.   Genitourinary: Negative.   Musculoskeletal: Negative.   Skin: Negative.   Allergic/Immunologic: Negative.   Neurological: Negative.   Hematological: Negative.   Psychiatric/Behavioral: Negative.     Exam:   Ht 5' 3.25" (1.607 m)   Wt 156 lb (70.8 kg)  LMP 05/17/1998 Comment: pt had bleeding from polyp 2/15  BMI 27.42 kg/m   Height: 5' 3.25" (160.7 cm)  General appearance: alert, cooperative and appears stated age  Breasts: normal appearance, no masses or tenderness  Neurologic: Grossly normal   Pelvic: External genitalia:  no lesions              Urethra:  normal appearing urethra with no masses, tenderness or lesions              Bartholins and Skenes: normal                 Vagina: atrophy, minimal discharge              Cervix: no cervical motion tenderness and no lesions              Pap taken: No. Bimanual Exam:  Uterus:  normal size, contour, position, consistency, mobility, non-tender              Adnexa: no mass, fullness, tenderness               Rectovaginal:  Confirms               Anus:  normal sphincter tone, no lesions  Lovena Le, CMA Chaperone was present for exam.  A:  Breast and Pelvic exam  P:   Mammogram due 2022  pap smear 2024  DEXA , will order, pt to call to schedule at the same time as mammogram.  F/U 2 years, sooner if problems

## 2020-05-05 ENCOUNTER — Ambulatory Visit (INDEPENDENT_AMBULATORY_CARE_PROVIDER_SITE_OTHER): Payer: Medicare HMO | Admitting: Nurse Practitioner

## 2020-05-05 ENCOUNTER — Other Ambulatory Visit: Payer: Self-pay | Admitting: Nurse Practitioner

## 2020-05-05 ENCOUNTER — Other Ambulatory Visit: Payer: Self-pay

## 2020-05-05 ENCOUNTER — Encounter: Payer: Self-pay | Admitting: Nurse Practitioner

## 2020-05-05 VITALS — BP 110/70 | HR 70 | Resp 16 | Ht 63.25 in | Wt 156.0 lb

## 2020-05-05 DIAGNOSIS — Z124 Encounter for screening for malignant neoplasm of cervix: Secondary | ICD-10-CM | POA: Diagnosis not present

## 2020-05-05 DIAGNOSIS — Z1231 Encounter for screening mammogram for malignant neoplasm of breast: Secondary | ICD-10-CM

## 2020-05-05 DIAGNOSIS — Z780 Asymptomatic menopausal state: Secondary | ICD-10-CM

## 2020-05-05 DIAGNOSIS — M8580 Other specified disorders of bone density and structure, unspecified site: Secondary | ICD-10-CM

## 2020-05-05 NOTE — Patient Instructions (Addendum)
Next pap smear due 2024 Next mammogram 06/2020 Bone Density test order placed, please call to schedule  Health Maintenance After Age 71 After age 69, you are at a higher risk for certain long-term diseases and infections as well as injuries from falls. Falls are a major cause of broken bones and head injuries in people who are older than age 80. Getting regular preventive care can help to keep you healthy and well. Preventive care includes getting regular testing and making lifestyle changes as recommended by your health care provider. Talk with your health care provider about:  Which screenings and tests you should have. A screening is a test that checks for a disease when you have no symptoms.  A diet and exercise plan that is right for you. What should I know about screenings and tests to prevent falls? Screening and testing are the best ways to find a health problem early. Early diagnosis and treatment give you the best chance of managing medical conditions that are common after age 18. Certain conditions and lifestyle choices may make you more likely to have a fall. Your health care provider may recommend:  Regular vision checks. Poor vision and conditions such as cataracts can make you more likely to have a fall. If you wear glasses, make sure to get your prescription updated if your vision changes.  Medicine review. Work with your health care provider to regularly review all of the medicines you are taking, including over-the-counter medicines. Ask your health care provider about any side effects that may make you more likely to have a fall. Tell your health care provider if any medicines that you take make you feel dizzy or sleepy.  Osteoporosis screening. Osteoporosis is a condition that causes the bones to get weaker. This can make the bones weak and cause them to break more easily.  Blood pressure screening. Blood pressure changes and medicines to control blood pressure can make you  feel dizzy.  Strength and balance checks. Your health care provider may recommend certain tests to check your strength and balance while standing, walking, or changing positions.  Foot health exam. Foot pain and numbness, as well as not wearing proper footwear, can make you more likely to have a fall.  Depression screening. You may be more likely to have a fall if you have a fear of falling, feel emotionally low, or feel unable to do activities that you used to do.  Alcohol use screening. Using too much alcohol can affect your balance and may make you more likely to have a fall. What actions can I take to lower my risk of falls? General instructions  Talk with your health care provider about your risks for falling. Tell your health care provider if: ? You fall. Be sure to tell your health care provider about all falls, even ones that seem minor. ? You feel dizzy, sleepy, or off-balance.  Take over-the-counter and prescription medicines only as told by your health care provider. These include any supplements.  Eat a healthy diet and maintain a healthy weight. A healthy diet includes low-fat dairy products, low-fat (lean) meats, and fiber from whole grains, beans, and lots of fruits and vegetables. Home safety  Remove any tripping hazards, such as rugs, cords, and clutter.  Install safety equipment such as grab bars in bathrooms and safety rails on stairs.  Keep rooms and walkways well-lit. Activity   Follow a regular exercise program to stay fit. This will help you maintain your balance. Ask your  health care provider what types of exercise are appropriate for you.  If you need a cane or walker, use it as recommended by your health care provider.  Wear supportive shoes that have nonskid soles. Lifestyle  Do not drink alcohol if your health care provider tells you not to drink.  If you drink alcohol, limit how much you have: ? 0-1 drink a day for women. ? 0-2 drinks a day for  men.  Be aware of how much alcohol is in your drink. In the [place] one drink equals one typical bottle of beer (12 oz), one-half glass of wine (5 oz), or one shot of hard liquor (1 oz).  Do not use any products that contain nicotine or tobacco, such as cigarettes and e-cigarettes. If you need help quitting, ask your health care provider. Summary  Having a healthy lifestyle and getting preventive care can help to protect your health and wellness after age 34.  Screening and testing are the best way to find a health problem early and help you avoid having a fall. Early diagnosis and treatment give you the best chance for managing medical conditions that are more common for people who are older than age 70.  Falls are a major cause of broken bones and head injuries in people who are older than age 44. Take precautions to prevent a fall at home.  Work with your health care provider to learn what changes you can make to improve your health and wellness and to prevent falls. This information is not intended to replace advice given to you by your health care provider. Make sure you discuss any questions you have with your health care provider. Document Revised: 08/24/2018 Document Reviewed: 03/16/2017 Elsevier Patient Education  2020 [employer].

## 2020-05-14 DIAGNOSIS — E890 Postprocedural hypothyroidism: Secondary | ICD-10-CM | POA: Diagnosis not present

## 2020-05-14 DIAGNOSIS — E785 Hyperlipidemia, unspecified: Secondary | ICD-10-CM | POA: Diagnosis not present

## 2020-05-21 DIAGNOSIS — Z1331 Encounter for screening for depression: Secondary | ICD-10-CM | POA: Diagnosis not present

## 2020-05-21 DIAGNOSIS — L80 Vitiligo: Secondary | ICD-10-CM | POA: Diagnosis not present

## 2020-05-21 DIAGNOSIS — Z1212 Encounter for screening for malignant neoplasm of rectum: Secondary | ICD-10-CM | POA: Diagnosis not present

## 2020-05-21 DIAGNOSIS — E785 Hyperlipidemia, unspecified: Secondary | ICD-10-CM | POA: Diagnosis not present

## 2020-05-21 DIAGNOSIS — C73 Malignant neoplasm of thyroid gland: Secondary | ICD-10-CM | POA: Diagnosis not present

## 2020-05-21 DIAGNOSIS — J45909 Unspecified asthma, uncomplicated: Secondary | ICD-10-CM | POA: Diagnosis not present

## 2020-05-21 DIAGNOSIS — Z1339 Encounter for screening examination for other mental health and behavioral disorders: Secondary | ICD-10-CM | POA: Diagnosis not present

## 2020-05-21 DIAGNOSIS — Z0000 Encounter for general adult medical examination without abnormal findings: Secondary | ICD-10-CM | POA: Diagnosis not present

## 2020-05-21 DIAGNOSIS — D126 Benign neoplasm of colon, unspecified: Secondary | ICD-10-CM | POA: Diagnosis not present

## 2020-05-21 DIAGNOSIS — E890 Postprocedural hypothyroidism: Secondary | ICD-10-CM | POA: Diagnosis not present

## 2020-05-21 DIAGNOSIS — K119 Disease of salivary gland, unspecified: Secondary | ICD-10-CM | POA: Diagnosis not present

## 2020-05-21 DIAGNOSIS — M8580 Other specified disorders of bone density and structure, unspecified site: Secondary | ICD-10-CM | POA: Diagnosis not present

## 2020-05-21 DIAGNOSIS — K219 Gastro-esophageal reflux disease without esophagitis: Secondary | ICD-10-CM | POA: Diagnosis not present

## 2020-06-13 DIAGNOSIS — H524 Presbyopia: Secondary | ICD-10-CM | POA: Diagnosis not present

## 2020-06-13 DIAGNOSIS — H00012 Hordeolum externum right lower eyelid: Secondary | ICD-10-CM | POA: Diagnosis not present

## 2020-06-13 DIAGNOSIS — H52223 Regular astigmatism, bilateral: Secondary | ICD-10-CM | POA: Diagnosis not present

## 2020-06-13 DIAGNOSIS — H5203 Hypermetropia, bilateral: Secondary | ICD-10-CM | POA: Diagnosis not present

## 2020-06-13 DIAGNOSIS — H0012 Chalazion right lower eyelid: Secondary | ICD-10-CM | POA: Diagnosis not present

## 2020-07-29 ENCOUNTER — Other Ambulatory Visit: Payer: Self-pay

## 2020-07-29 ENCOUNTER — Ambulatory Visit: Payer: Medicare HMO | Admitting: Podiatry

## 2020-07-29 DIAGNOSIS — B351 Tinea unguium: Secondary | ICD-10-CM

## 2020-07-29 NOTE — Patient Instructions (Signed)
Efinaconazole Topical Solution What is this medicine? EFINACONAZOLE (e FEE na KON a zole) is an antifungal medicine. It is used to treat certain kinds of fungal infections of the toenail. This medicine may be used for other purposes; ask your health care provider or pharmacist if you have questions. COMMON BRAND NAME(S): JUBLIA What should I tell my health care provider before I take this medicine? They need to know if you have any of these conditions:  an unusual or allergic reaction to efinaconazole, other medicines, foods, dyes or preservatives  pregnant or trying to get pregnant  breast-feeding How should I use this medicine? This medicine is for external use only. Do not take by mouth. Follow the directions on the label. Wash hands before and after use. Apply this medicine using the provided brush to cover the entire toenail. Do not use your medicine more often than directed. Finish the full course prescribed by your doctor or health care professional even if you think your condition is better. Do not stop using except on the advice of your doctor or health care professional. Talk to your pediatrician regarding the use of this medicine in children. While this drug may be prescribed for children as young as 6 years for selected conditions, precautions do apply. Overdosage: If you think you have taken too much of this medicine contact a poison control center or emergency room at once. NOTE: This medicine is only for you. Do not share this medicine with others. What if I miss a dose? If you miss a dose, use it as soon as you can. If it is almost time for your next dose, use only that dose. Do not use double or extra doses. What may interact with this medicine? Interactions have not been studied. Do not use any other nail products (i.e., nail polish, pedicures) during treatment with this medicine. This list may not describe all possible interactions. Give your health care provider a list of  all the medicines, herbs, non-prescription drugs, or dietary supplements you use. Also tell them if you smoke, drink alcohol, or use illegal drugs. Some items may interact with your medicine. What should I watch for while using this medicine? Do not get this medicine in your eyes. If you do, rinse out with plenty of cool tap water. Tell your doctor or health care professional if your symptoms do not start to get better or if they get worse. Wait for at least 10 minutes after bathing before applying this medication. After bathing, make sure that your feet are very dry. Fungal infections like moist conditions. Do not walk around barefoot. To help prevent reinfection, wear freshly washed cotton, not synthetic clothing. Tell your doctor or health care professional if you develop sores or blisters that do not heal properly. If your nail infection returns after you stop using this medicine, contact your doctor or health care professional. What side effects may I notice from receiving this medicine? Side effects that you should report to your doctor or health care professional as soon as possible:  allergic reactions like skin rash, itching or hives, swelling of the face, lips, or tongue  ingrown toenail Side effects that usually do not require medical attention (report to your doctor or health care professional if they continue or are bothersome):  mild skin irritation, burning, or itching This list may not describe all possible side effects. Call your doctor for medical advice about side effects. You may report side effects to FDA at 1-800-FDA-1088. Where should I  keep my medicine? Keep out of the reach of children. Store at room temperature between 20 and 25 degrees C (68 and 77 degrees F). Keep this medicine in the original container. Throw away any unused medicine after the expiration date. This medicine is flammable. Avoid exposure to heat, fire, flame, and smoking. NOTE: This sheet is a summary.  It may not cover all possible information. If you have questions about this medicine, talk to your doctor, pharmacist, or health care provider.  2021 Elsevier/Gold Standard (2018-09-11 16:14:11) Terbinafine oral granules What is this medicine? TERBINAFINE (TER bin a feen) is an antifungal medicine. It is used to treat certain kinds of fungal or yeast infections. This medicine may be used for other purposes; ask your health care provider or pharmacist if you have questions. COMMON BRAND NAME(S): Lamisil What should I tell my health care provider before I take this medicine? They need to know if you have any of these conditions:  drink alcoholic beverages  kidney disease  liver disease  an unusual or allergic reaction to Terbinafine, other medicines, foods, dyes, or preservatives  pregnant or trying to get pregnant  breast-feeding How should I use this medicine? Take this medicine by mouth. Follow the directions on the prescription label. Hold packet with cut line on top. Shake packet gently to settle contents. Tear packet open along cut line, or use scissors to cut across line. Carefully pour the entire contents of packet onto a spoonful of a soft food, such as pudding or other soft, non-acidic food such as mashed potatoes (do NOT use applesauce or a fruit-based food). If two packets are required for each dose, you may either sprinkle the content of both packets on one spoonful of non-acidic food, or sprinkle the contents of both packets on two spoonfuls of non-acidic food. Make sure that no granules remain in the packet. Swallow the mxiture of the food and granules without chewing. Take your medicine at regular intervals. Do not take it more often than directed. Take all of your medicine as directed even if you think you are better. Do not skip doses or stop your medicine early. Contact your pediatrician or health care professional regarding the use of this medicine in children. While this  medicine may be prescribed for children as young as 4 years for selected conditions, precautions do apply. Overdosage: If you think you have taken too much of this medicine contact a poison control center or emergency room at once. NOTE: This medicine is only for you. Do not share this medicine with others. What if I miss a dose? If you miss a dose, take it as soon as you can. If it is almost time for your next dose, take only that dose. Do not take double or extra doses. What may interact with this medicine? Do not take this medicine with any of the following medications:  thioridazine This medicine may also interact with the following medications:  beta-blockers  caffeine  cimetidine  cyclosporine  MAOIs like Carbex, Eldepryl, Marplan, Nardil, and Parnate  medicines for fungal infections like fluconazole and ketoconazole  medicines for irregular heartbeat like amiodarone, flecainide and propafenone  rifampin  SSRIs like citalopram, escitalopram, fluoxetine, fluvoxamine, paroxetine and sertraline  tricyclic antidepressants like amitriptyline, clomipramine, desipramine, imipramine, nortriptyline, and others  warfarin This list may not describe all possible interactions. Give your health care provider a list of all the medicines, herbs, non-prescription drugs, or dietary supplements you use. Also tell them if you smoke, drink alcohol,  or use illegal drugs. Some items may interact with your medicine. What should I watch for while using this medicine? Your doctor may monitor your liver function. Tell your doctor right away if you have nausea or vomiting, loss of appetite, stomach pain on your right upper side, yellow skin, dark urine, light stools, or are over tired. This medicine may cause serious skin reactions. They can happen weeks to months after starting the medicine. Contact your health care provider right away if you notice fevers or flu-like symptoms with a rash. The rash may  be red or purple and then turn into blisters or peeling of the skin. Or, you might notice a red rash with swelling of the face, lips or lymph nodes in your neck or under your arms. You need to take this medicine for 6 weeks or longer to cure the fungal infection. Take your medicine regularly for as long as your doctor or health care provider tells you to. What side effects may I notice from receiving this medicine? Side effects that you should report to your doctor or health care professional as soon as possible:  allergic reactions like skin rash or hives, swelling of the face, lips, or tongue  change in vision  dark urine  fever or infection  general ill feeling or flu-like symptoms  light-colored stools  loss of appetite, nausea  rash, fever, and swollen lymph nodes  redness, blistering, peeling or loosening of the skin, including inside the mouth  right upper belly pain  unusually weak or tired  yellowing of the eyes or skin Side effects that usually do not require medical attention (report to your doctor or health care professional if they continue or are bothersome):  changes in taste  diarrhea  hair loss  muscle or joint pain  stomach upset This list may not describe all possible side effects. Call your doctor for medical advice about side effects. You may report side effects to FDA at 1-800-FDA-1088. Where should I keep my medicine? Keep out of the reach of children. Store at room temperature between 15 and 30 degrees C (59 and 86 degrees F). Throw away any unused medicine after the expiration date. NOTE: This sheet is a summary. It may not cover all possible information. If you have questions about this medicine, talk to your doctor, pharmacist, or health care provider.  2021 Elsevier/Gold Standard (2018-08-11 15:35:11)

## 2020-08-05 NOTE — Progress Notes (Signed)
Subjective: 70 year old female presents the office today for follow-up evaluation of nail fungus.  She has been trying topical medication with a significant provement.  She is also been on laser therapy without any improvement.  She wants to see there is any other options versus holding off any further treatment.  She denies any pain in the nail denies any redness or drainage or any swelling. Denies any systemic complaints such as fevers, chills, nausea, vomiting. No acute changes since last appointment, and no other complaints at this time.   Objective: AAO x3, NAD DP/PT pulses palpable bilaterally, CRT less than 3 seconds Over the left hallux toenails hypertrophic, dystrophic with yellow-brown discoloration.  There is no edema, erythema or signs of infection.  Is no pain.  Is no open lesions. No pain with calf compression, swelling, warmth, erythema  Assessment: Onychomycosis  Plan: -All treatment options discussed with the patient including all alternatives, risks, complications.  -We discussed multiple treatment options occluding oral treatment, topical as well as other options.  After discussion she wants to think about her options.  She is to contact us what she decides.  We also discussed hold off any treatment just watching the nail over time as well. -Patient encouraged to call the office with any questions, concerns, change in symptoms.   Trula Slade DPM

## 2020-08-13 ENCOUNTER — Ambulatory Visit
Admission: RE | Admit: 2020-08-13 | Discharge: 2020-08-13 | Disposition: A | Discharge status: Home / Self Care | Payer: Medicare HMO | Source: Ambulatory Visit | Attending: Nurse Practitioner | Admitting: Nurse Practitioner

## 2020-08-13 ENCOUNTER — Other Ambulatory Visit: Payer: Self-pay

## 2020-08-13 DIAGNOSIS — Z780 Asymptomatic menopausal state: Secondary | ICD-10-CM

## 2020-08-13 DIAGNOSIS — Z1231 Encounter for screening mammogram for malignant neoplasm of breast: Secondary | ICD-10-CM

## 2020-08-13 DIAGNOSIS — M8580 Other specified disorders of bone density and structure, unspecified site: Secondary | ICD-10-CM

## 2020-08-13 DIAGNOSIS — M8589 Other specified disorders of bone density and structure, multiple sites: Secondary | ICD-10-CM | POA: Diagnosis not present

## 2020-08-18 NOTE — Progress Notes (Signed)
Through consultation with Dr. Quincy Simmonds, will use the following criteria for medication management:  FRAX threshold of >= 3% risk of hip fracture or >=20% risk of major osteoporotic fracture to offer pharmacologic treatment for osteopenia.

## 2020-09-15 DIAGNOSIS — E785 Hyperlipidemia, unspecified: Secondary | ICD-10-CM | POA: Diagnosis not present

## 2020-10-15 DIAGNOSIS — R059 Cough, unspecified: Secondary | ICD-10-CM | POA: Diagnosis not present

## 2020-10-15 DIAGNOSIS — J45909 Unspecified asthma, uncomplicated: Secondary | ICD-10-CM | POA: Diagnosis not present

## 2020-10-15 DIAGNOSIS — E890 Postprocedural hypothyroidism: Secondary | ICD-10-CM | POA: Diagnosis not present

## 2020-10-15 DIAGNOSIS — K219 Gastro-esophageal reflux disease without esophagitis: Secondary | ICD-10-CM | POA: Diagnosis not present

## 2020-10-15 DIAGNOSIS — R251 Tremor, unspecified: Secondary | ICD-10-CM | POA: Diagnosis not present

## 2020-11-07 DIAGNOSIS — J329 Chronic sinusitis, unspecified: Secondary | ICD-10-CM | POA: Diagnosis not present

## 2020-12-16 DIAGNOSIS — H2513 Age-related nuclear cataract, bilateral: Secondary | ICD-10-CM | POA: Diagnosis not present

## 2020-12-16 DIAGNOSIS — H5203 Hypermetropia, bilateral: Secondary | ICD-10-CM | POA: Diagnosis not present

## 2021-01-09 DIAGNOSIS — E785 Hyperlipidemia, unspecified: Secondary | ICD-10-CM | POA: Diagnosis not present

## 2021-01-09 DIAGNOSIS — E890 Postprocedural hypothyroidism: Secondary | ICD-10-CM | POA: Diagnosis not present

## 2021-02-04 DIAGNOSIS — L718 Other rosacea: Secondary | ICD-10-CM | POA: Diagnosis not present

## 2021-03-10 DIAGNOSIS — B354 Tinea corporis: Secondary | ICD-10-CM | POA: Diagnosis not present

## 2021-06-03 DIAGNOSIS — E890 Postprocedural hypothyroidism: Secondary | ICD-10-CM | POA: Diagnosis not present

## 2021-06-03 DIAGNOSIS — E785 Hyperlipidemia, unspecified: Secondary | ICD-10-CM | POA: Diagnosis not present

## 2021-06-03 DIAGNOSIS — C73 Malignant neoplasm of thyroid gland: Secondary | ICD-10-CM | POA: Diagnosis not present

## 2021-06-10 DIAGNOSIS — Z0000 Encounter for general adult medical examination without abnormal findings: Secondary | ICD-10-CM | POA: Diagnosis not present

## 2021-06-10 DIAGNOSIS — Z1331 Encounter for screening for depression: Secondary | ICD-10-CM | POA: Diagnosis not present

## 2021-06-10 DIAGNOSIS — M8580 Other specified disorders of bone density and structure, unspecified site: Secondary | ICD-10-CM | POA: Diagnosis not present

## 2021-06-10 DIAGNOSIS — D126 Benign neoplasm of colon, unspecified: Secondary | ICD-10-CM | POA: Diagnosis not present

## 2021-06-10 DIAGNOSIS — Z1212 Encounter for screening for malignant neoplasm of rectum: Secondary | ICD-10-CM | POA: Diagnosis not present

## 2021-06-10 DIAGNOSIS — E785 Hyperlipidemia, unspecified: Secondary | ICD-10-CM | POA: Diagnosis not present

## 2021-06-10 DIAGNOSIS — M7501 Adhesive capsulitis of right shoulder: Secondary | ICD-10-CM | POA: Diagnosis not present

## 2021-06-10 DIAGNOSIS — R82998 Other abnormal findings in urine: Secondary | ICD-10-CM | POA: Diagnosis not present

## 2021-06-10 DIAGNOSIS — K219 Gastro-esophageal reflux disease without esophagitis: Secondary | ICD-10-CM | POA: Diagnosis not present

## 2021-06-10 DIAGNOSIS — E890 Postprocedural hypothyroidism: Secondary | ICD-10-CM | POA: Diagnosis not present

## 2021-06-10 DIAGNOSIS — Z1339 Encounter for screening examination for other mental health and behavioral disorders: Secondary | ICD-10-CM | POA: Diagnosis not present

## 2021-06-10 DIAGNOSIS — J45909 Unspecified asthma, uncomplicated: Secondary | ICD-10-CM | POA: Diagnosis not present

## 2021-06-10 DIAGNOSIS — M5416 Radiculopathy, lumbar region: Secondary | ICD-10-CM | POA: Diagnosis not present

## 2021-06-12 ENCOUNTER — Other Ambulatory Visit: Payer: Self-pay | Admitting: Endocrinology

## 2021-06-12 DIAGNOSIS — M5416 Radiculopathy, lumbar region: Secondary | ICD-10-CM

## 2021-07-02 ENCOUNTER — Ambulatory Visit
Admission: RE | Admit: 2021-07-02 | Discharge: 2021-07-02 | Disposition: A | Discharge status: Home / Self Care | Payer: Medicare HMO | Source: Ambulatory Visit | Attending: Endocrinology | Admitting: Endocrinology

## 2021-07-02 ENCOUNTER — Other Ambulatory Visit: Payer: Self-pay

## 2021-07-02 DIAGNOSIS — M5127 Other intervertebral disc displacement, lumbosacral region: Secondary | ICD-10-CM | POA: Diagnosis not present

## 2021-07-02 DIAGNOSIS — R531 Weakness: Secondary | ICD-10-CM | POA: Diagnosis not present

## 2021-07-02 DIAGNOSIS — M5416 Radiculopathy, lumbar region: Secondary | ICD-10-CM

## 2021-07-02 DIAGNOSIS — M5126 Other intervertebral disc displacement, lumbar region: Secondary | ICD-10-CM | POA: Diagnosis not present

## 2021-07-02 MED ORDER — GADOBENATE DIMEGLUMINE 529 MG/ML IV SOLN
14.0000 mL | Freq: Once | INTRAVENOUS | Status: AC | PRN
  Administered 2021-07-02: 14 mL via INTRAVENOUS

## 2021-07-08 ENCOUNTER — Encounter: Payer: Self-pay | Admitting: *Deleted

## 2021-07-08 ENCOUNTER — Ambulatory Visit: Payer: Medicare HMO | Admitting: Psychiatry

## 2021-07-08 ENCOUNTER — Telehealth: Payer: Self-pay | Admitting: Psychiatry

## 2021-07-08 VITALS — BP 133/81 | HR 65 | Ht 63.0 in | Wt 156.0 lb

## 2021-07-08 DIAGNOSIS — R519 Headache, unspecified: Secondary | ICD-10-CM | POA: Diagnosis not present

## 2021-07-08 NOTE — Telephone Encounter (Signed)
[employer] order sent to [facility], they will obtain the auth and reach out to the patient to schedule.  

## 2021-07-08 NOTE — Patient Instructions (Addendum)
MRI and MRA brain Blood work to look for inflammation Can try taking melatonin 3-5 mg at bedtime to help reduce headcahes

## 2021-07-08 NOTE — Progress Notes (Signed)
Referring:  Reynold Bowen, MD [address] [place],  [place] [address]  PCP: Reynold Bowen, MD  Neurology was asked to evaluate Kelly Ingram, a 71 year old female for a chief complaint of headaches.  Our recommendations of care will be communicated by shared medical record.    CC:  headaches  HPI:  Medical co-morbidities: hypothyroidism, thyroid cancer  The patient presents for evaluation of headaches which began 1 year ago. There were no clear triggers for the onset of her headache. Headaches are described as sudden, severe pain lasting for seconds at a time. Initially they would occur at any spot on her head, but now they occur predominantly on the left side. They have been progressively increasing in frequency and lasting longer. Headaches are triggered by coughing, laughing, and bending forward, but can also occur at random. They can occur 4-5 times per day.  Headache History: Onset: 1 year ago Triggers: coughing, bending forward, laughing Aura: no Location: L>R temple Quality/Description: intense pressure Associated Symptoms:  Photophobia: no  Phonophobia: no  Nausea: no Worse with activity?: no Duration of headaches: 5 seconds Red flags:   New onset age>50  Positional component  Headache days per month: 30 Headache free days per month: 0  Current Treatment: Abortive no  Preventative no  Prior Therapies                                 Effexor - tremors, cognitive changes   Headache Risk Factors: Headache risk factors and/or co-morbidities (+) Neck Pain - stiffness (-) Obesity  Body mass index is 27.63 kg/m. (-) History of Traumatic Brain Injury and/or Concussion  LABS: 06/03/21: CBC, CMP, TSH wnl  IMAGING:  none  Current Outpatient Medications on File Prior to Visit  Medication Sig Dispense Refill   Cholecalciferol (VITAMIN D3) 10 MCG (400 UNIT) CAPS Take 1 capsule by mouth daily.     fexofenadine (ALLEGRA) 180 MG tablet Take 180 mg by  mouth daily.     levothyroxine (SYNTHROID, LEVOTHROID) 125 MCG tablet Take 125 mcg by mouth daily before breakfast. One tablet daily 6 days a week     Magnesium 100 MG CAPS Take 1 capsule by mouth daily.     Misc Natural Products (JOINT HEALTH PO) Take 2 tablets by mouth daily.     Multiple Vitamin (STRESSTABS PO) Take 1 tablet by mouth daily.     Multiple Vitamins-Minerals (ZINC PO) Take by mouth.     Probiotic Product (PROBIOTIC DAILY PO) Take 1 capsule by mouth daily.     TURMERIC PO Take 1 tablet by mouth daily.      valACYclovir (VALTREX) 500 MG tablet as needed.  1   hydrocortisone (ANUSOL-HC) 2.5 % rectal cream Place 1 application rectally as needed.      No current facility-administered medications on file prior to visit.     Allergies: Allergies  Allergen Reactions   Effexor [Venlafaxine] Other (See Comments)    Cognitive change, tremors. - July 30, 2013.   Erythromycin Nausea Only    GI upset.   Gluten Meal     Other reaction(s): Diarrhea, Headache   Adhesive [Tape]     Family History: Migraine or other headaches in the family:  no Aneurysms in a first degree relative:  no Brain tumors in the family:  no Other neurological illness in the family:   aunt had a stroke  Past Medical History: Past Medical History:  Diagnosis Date   Asthma    Cancer ([place]) 2008   thyroid   Endometrial polyp 06/2013   benign   GERD (gastroesophageal reflux disease)    IBS (irritable bowel syndrome)    Seasonal allergies    Thyroid disease 2008   hx thyroid cancer--bilateral nodules    Past Surgical History Past Surgical History:  Procedure Laterality Date   COLONOSCOPY     12 yrs ago-normal   DILATATION & CURRETTAGE/HYSTEROSCOPY WITH RESECTOCOPE N/A 07/03/2013   Procedure: DILATATION & CURETTAGE/HYSTEROSCOPY WITH RESECTOCOPE;  Surgeon: Jamey Reas de Berton Lan, MD;  Location: [facility];  Service: Gynecology;  Laterality: N/A;   DILATION AND CURETTAGE OF UTERUS      SHOULDER SURGERY Left    TOTAL THYROIDECTOMY  2008   vocal cord implant Right 2009    Social History: Social History   Tobacco Use   Smoking status: Never   Smokeless tobacco: Never  Vaping Use   Vaping Use: Never used  Substance Use Topics   Alcohol use: Yes    Comment: 2-3 weekly   Drug use: No    ROS: Negative for fevers, chills. Positive for headaches. All other systems reviewed and negative unless stated otherwise in HPI.   Physical Exam:   Vital Signs: BP 133/81    Pulse 65    Ht '5\' 3"'  (1.6 m)    Wt 156 lb (70.8 kg)    LMP 05/17/1998 Comment: pt had bleeding from polyp 2/15   BMI 27.63 kg/m  GENERAL: well appearing,in no acute distress,alert SKIN:  Color, texture, turgor normal. No rashes or lesions HEAD:  Normocephalic/atraumatic. CV:  RRR RESP: Normal respiratory effort MSK: no tenderness to palpation over occiput, neck, or shoulders  NEUROLOGICAL: Mental Status: Alert, oriented to person, place and time,Follows commands Cranial Nerves: PERRL, no papilledema visualized, visual fields intact to confrontation,extraocular movements intact,facial sensation intact,no facial droop or ptosis,hearing intact to finger rub bilaterally,no dysarthria Motor: muscle strength 5/5 both upper and lower extremities,no drift, normal tone Reflexes: 2+ throughout Sensation: intact to light touch all 4 extremities Coordination: Finger-to- nose-finger intact bilaterally Gait: normal-based   IMPRESSION: 71 year old female with a history of hypothyroidism, thyroid cancer who presents for evaluation of worsening headaches over the last year. Will order MRI/MRA brain as headaches are positional and have sudden onset. Will also check inflammatory markers today as headaches are predominantly in the left temple. If testing is normal this may represent primary stabbing headache. Advised patient that taking melatonin nightly can help reduce headache frequency.  PLAN: -MRI brain with  contrast, MRA head -ESR, CRP -Can start taking melatonin 3-5 mg at bedtime for headache prevention -next steps: consider indomethacin, gabapentin for headache prevention   I spent a total of 34 minutes chart reviewing and counseling the patient. Headache education was done. Discussed treatment options including preventive medications and natural supplements. Discussed medication side effects, adverse reactions and drug interactions. Written educational materials and patient instructions outlining all of the above were given.  Follow-up: after testing   Genia Harold, MD 07/08/2021   3:30 PM

## 2021-07-09 LAB — SEDIMENTATION RATE: Sed Rate: 13 mm/hr (ref 0–40)

## 2021-07-09 LAB — C-REACTIVE PROTEIN: CRP: 1 mg/L (ref 0–10)

## 2021-07-17 DIAGNOSIS — R030 Elevated blood-pressure reading, without diagnosis of hypertension: Secondary | ICD-10-CM | POA: Diagnosis not present

## 2021-07-17 DIAGNOSIS — M546 Pain in thoracic spine: Secondary | ICD-10-CM | POA: Diagnosis not present

## 2021-07-17 DIAGNOSIS — M4712 Other spondylosis with myelopathy, cervical region: Secondary | ICD-10-CM | POA: Diagnosis not present

## 2021-07-17 DIAGNOSIS — Z6827 Body mass index (BMI) 27.0-27.9, adult: Secondary | ICD-10-CM | POA: Diagnosis not present

## 2021-07-20 ENCOUNTER — Ambulatory Visit
Admission: RE | Admit: 2021-07-20 | Discharge: 2021-07-20 | Disposition: A | Discharge status: Home / Self Care | Payer: Medicare HMO | Source: Ambulatory Visit | Attending: Psychiatry | Admitting: Psychiatry

## 2021-07-20 ENCOUNTER — Other Ambulatory Visit: Payer: Self-pay

## 2021-07-20 DIAGNOSIS — R519 Headache, unspecified: Secondary | ICD-10-CM | POA: Diagnosis not present

## 2021-07-20 MED ORDER — GADOBENATE DIMEGLUMINE 529 MG/ML IV SOLN
14.0000 mL | Freq: Once | INTRAVENOUS | Status: AC | PRN
  Administered 2021-07-20: 14 mL via INTRAVENOUS

## 2021-07-29 ENCOUNTER — Other Ambulatory Visit: Payer: Self-pay | Admitting: Endocrinology

## 2021-07-29 DIAGNOSIS — Z1231 Encounter for screening mammogram for malignant neoplasm of breast: Secondary | ICD-10-CM

## 2021-08-18 ENCOUNTER — Ambulatory Visit
Admission: RE | Admit: 2021-08-18 | Discharge: 2021-08-18 | Disposition: A | Discharge status: Home / Self Care | Payer: Medicare HMO | Source: Ambulatory Visit | Attending: Endocrinology | Admitting: Endocrinology

## 2021-08-18 DIAGNOSIS — Z1231 Encounter for screening mammogram for malignant neoplasm of breast: Secondary | ICD-10-CM

## 2021-08-24 DIAGNOSIS — R69 Illness, unspecified: Secondary | ICD-10-CM | POA: Diagnosis not present

## 2021-09-12 DIAGNOSIS — R69 Illness, unspecified: Secondary | ICD-10-CM | POA: Diagnosis not present

## 2021-10-20 DIAGNOSIS — R69 Illness, unspecified: Secondary | ICD-10-CM | POA: Diagnosis not present

## 2021-11-02 DIAGNOSIS — R69 Illness, unspecified: Secondary | ICD-10-CM | POA: Diagnosis not present

## 2021-11-24 DIAGNOSIS — R69 Illness, unspecified: Secondary | ICD-10-CM | POA: Diagnosis not present

## 2021-11-27 DIAGNOSIS — B351 Tinea unguium: Secondary | ICD-10-CM | POA: Diagnosis not present

## 2021-11-27 DIAGNOSIS — S40861A Insect bite (nonvenomous) of right upper arm, initial encounter: Secondary | ICD-10-CM | POA: Diagnosis not present

## 2021-12-02 DIAGNOSIS — M79671 Pain in right foot: Secondary | ICD-10-CM | POA: Diagnosis not present

## 2021-12-02 DIAGNOSIS — B351 Tinea unguium: Secondary | ICD-10-CM | POA: Diagnosis not present

## 2021-12-02 DIAGNOSIS — L600 Ingrowing nail: Secondary | ICD-10-CM | POA: Diagnosis not present

## 2021-12-02 DIAGNOSIS — M79672 Pain in left foot: Secondary | ICD-10-CM | POA: Diagnosis not present

## 2021-12-03 DIAGNOSIS — B351 Tinea unguium: Secondary | ICD-10-CM | POA: Diagnosis not present

## 2021-12-03 DIAGNOSIS — R69 Illness, unspecified: Secondary | ICD-10-CM | POA: Diagnosis not present

## 2021-12-04 DIAGNOSIS — R69 Illness, unspecified: Secondary | ICD-10-CM | POA: Diagnosis not present

## 2021-12-23 DIAGNOSIS — R69 Illness, unspecified: Secondary | ICD-10-CM | POA: Diagnosis not present

## 2021-12-30 DIAGNOSIS — L600 Ingrowing nail: Secondary | ICD-10-CM | POA: Diagnosis not present

## 2021-12-30 DIAGNOSIS — M79675 Pain in left toe(s): Secondary | ICD-10-CM | POA: Diagnosis not present

## 2021-12-30 DIAGNOSIS — B351 Tinea unguium: Secondary | ICD-10-CM | POA: Diagnosis not present

## 2021-12-30 DIAGNOSIS — M79674 Pain in right toe(s): Secondary | ICD-10-CM | POA: Diagnosis not present

## 2021-12-30 IMAGING — MG MM DIGITAL SCREENING BILAT W/ TOMO AND CAD
8 series · 8 of 24 positions shown · non-contrast
Comparison: Previous exam(s).

CLINICAL DATA: Screening.

EXAM:
DIGITAL SCREENING BILATERAL MAMMOGRAM WITH TOMOSYNTHESIS AND CAD
TECHNIQUE: Bilateral screening digital craniocaudal and mediolateral oblique
mammograms were obtained. Bilateral screening digital breast
tomosynthesis was performed. The images were evaluated with
computer-aided detection.

[L MLO synth-2D]
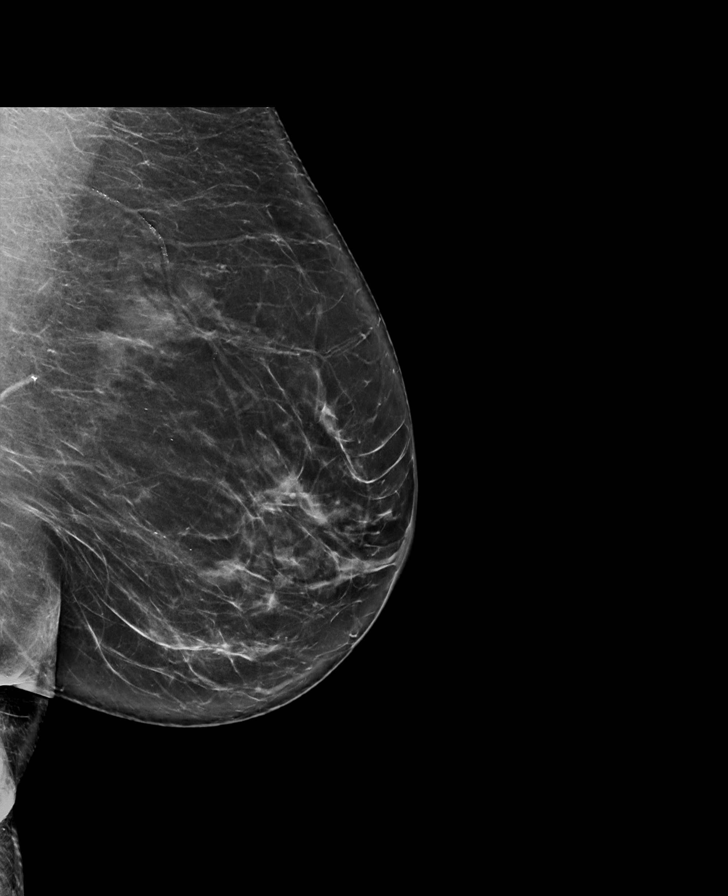

[R MLO synth-2D]
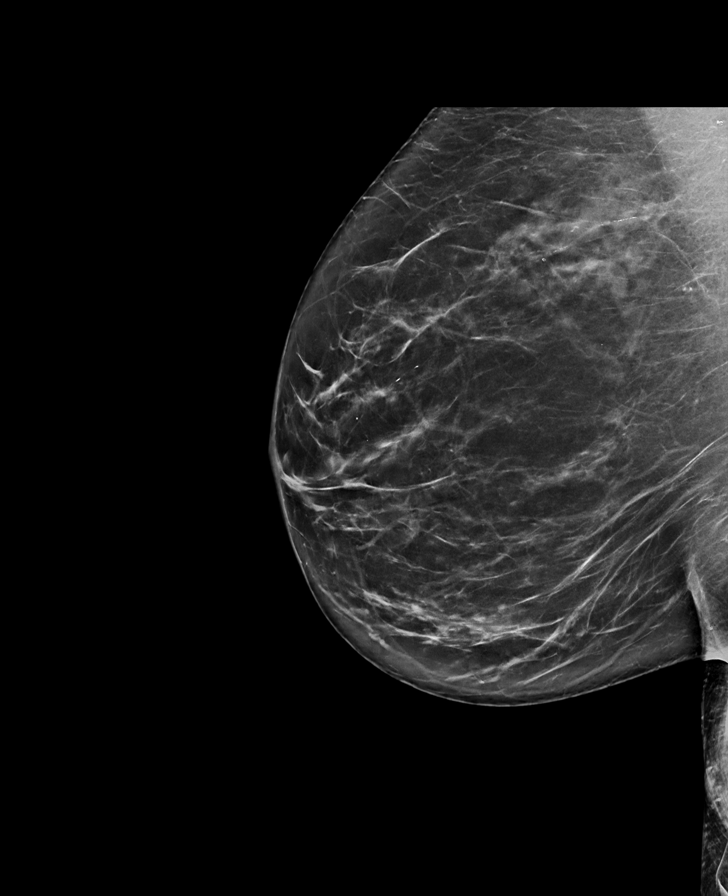

[R CC synth-2D]
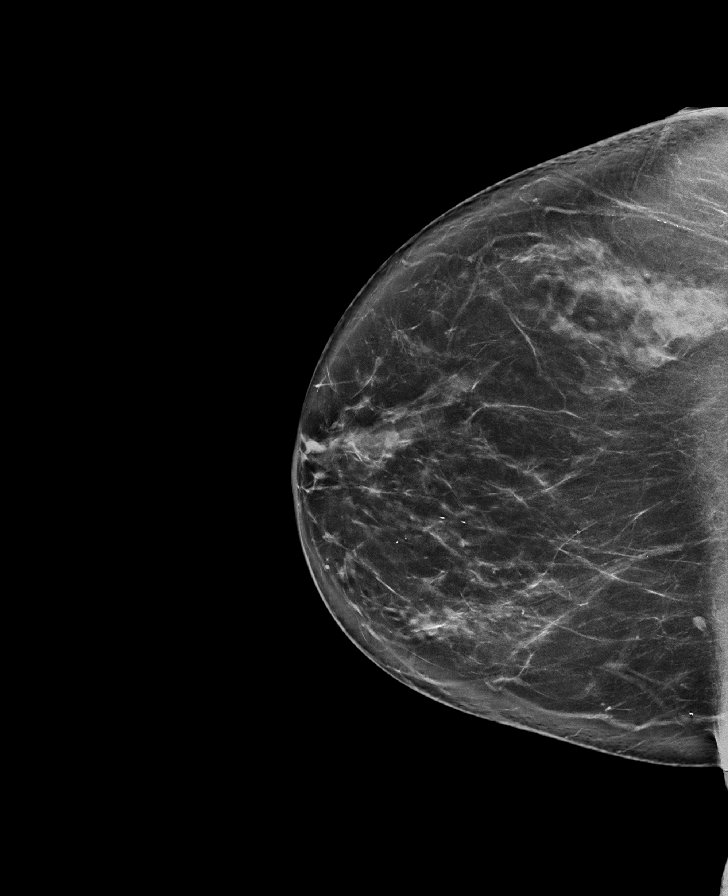

[L CC synth-2D]
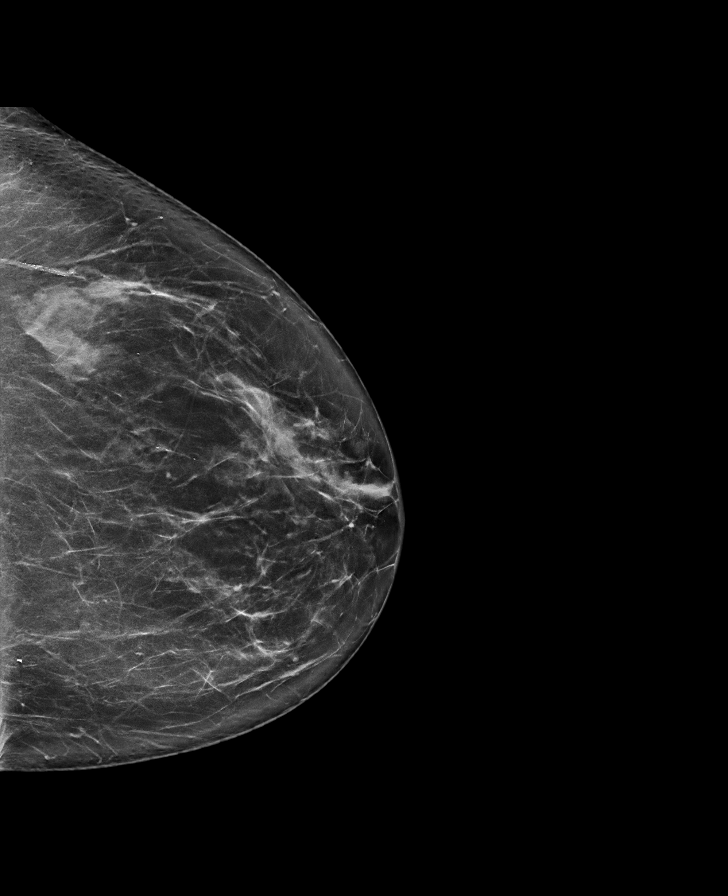

[L CC tomo · tomo slice 39/76.0]
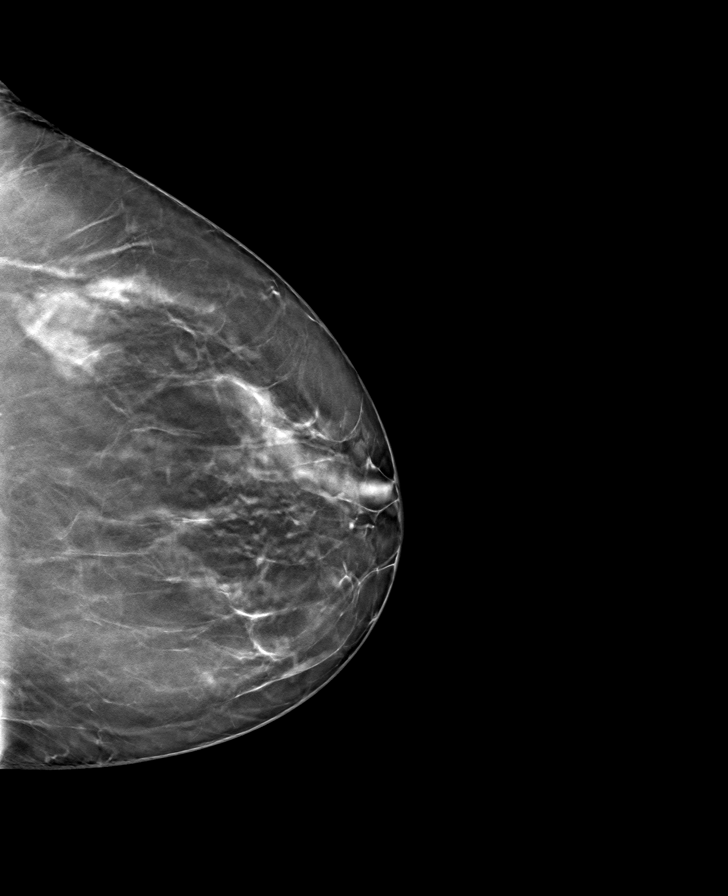

[L MLO tomo · tomo slice 41/82.0]
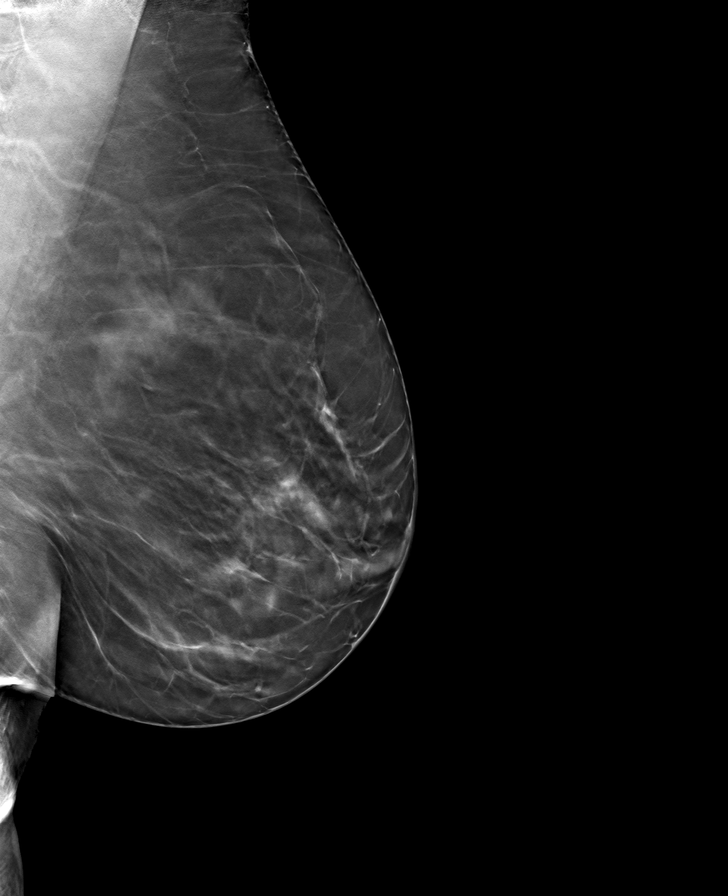

[R MLO tomo · tomo slice 42/83.0]
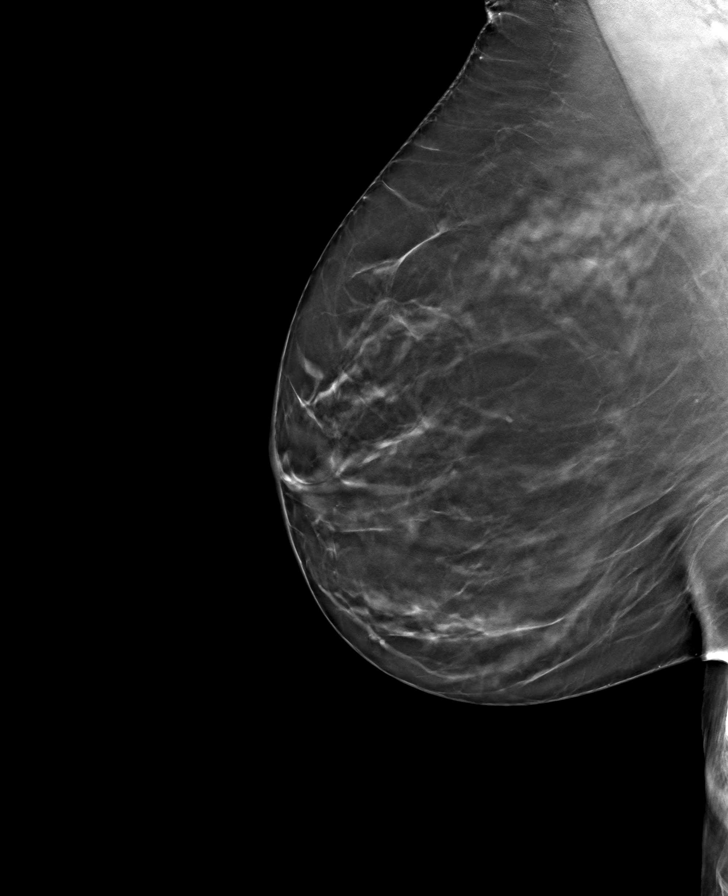

[R CC tomo · tomo slice 45/88.0]
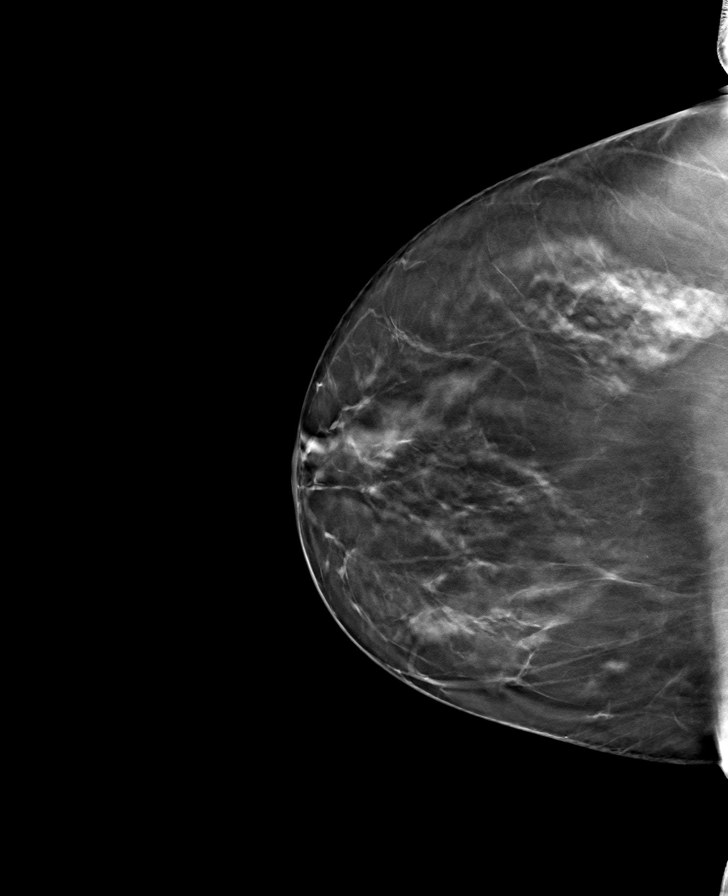

[8 of 24 positions shown; findings below may reference images not displayed]

ACR Breast Density Category b: There are scattered areas of
fibroglandular density.
FINDINGS: There are no findings suspicious for malignancy. The images were
evaluated with computer-aided detection.
IMPRESSION: No mammographic evidence of malignancy. A result letter of this
screening mammogram will be mailed directly to the patient.

RECOMMENDATION:
Screening mammogram in one year. (Code:WJ-I-BG6)

BI-RADS CATEGORY  1: Negative.

## 2022-01-07 DIAGNOSIS — R69 Illness, unspecified: Secondary | ICD-10-CM | POA: Diagnosis not present

## 2022-01-14 DIAGNOSIS — R69 Illness, unspecified: Secondary | ICD-10-CM | POA: Diagnosis not present

## 2022-01-27 DIAGNOSIS — M79674 Pain in right toe(s): Secondary | ICD-10-CM | POA: Diagnosis not present

## 2022-01-27 DIAGNOSIS — L600 Ingrowing nail: Secondary | ICD-10-CM | POA: Diagnosis not present

## 2022-01-27 DIAGNOSIS — B351 Tinea unguium: Secondary | ICD-10-CM | POA: Diagnosis not present

## 2022-01-27 DIAGNOSIS — M79675 Pain in left toe(s): Secondary | ICD-10-CM | POA: Diagnosis not present

## 2022-02-22 DIAGNOSIS — H43393 Other vitreous opacities, bilateral: Secondary | ICD-10-CM | POA: Diagnosis not present

## 2022-02-22 DIAGNOSIS — H524 Presbyopia: Secondary | ICD-10-CM | POA: Diagnosis not present

## 2022-02-22 DIAGNOSIS — H5202 Hypermetropia, left eye: Secondary | ICD-10-CM | POA: Diagnosis not present

## 2022-02-22 DIAGNOSIS — H2513 Age-related nuclear cataract, bilateral: Secondary | ICD-10-CM | POA: Diagnosis not present

## 2022-02-22 DIAGNOSIS — H52223 Regular astigmatism, bilateral: Secondary | ICD-10-CM | POA: Diagnosis not present

## 2022-02-24 DIAGNOSIS — L600 Ingrowing nail: Secondary | ICD-10-CM | POA: Diagnosis not present

## 2022-02-24 DIAGNOSIS — M79674 Pain in right toe(s): Secondary | ICD-10-CM | POA: Diagnosis not present

## 2022-02-24 DIAGNOSIS — B351 Tinea unguium: Secondary | ICD-10-CM | POA: Diagnosis not present

## 2022-02-24 DIAGNOSIS — M79675 Pain in left toe(s): Secondary | ICD-10-CM | POA: Diagnosis not present

## 2022-03-29 DIAGNOSIS — L820 Inflamed seborrheic keratosis: Secondary | ICD-10-CM | POA: Diagnosis not present

## 2022-03-31 DIAGNOSIS — R69 Illness, unspecified: Secondary | ICD-10-CM | POA: Diagnosis not present

## 2022-03-31 DIAGNOSIS — M9902 Segmental and somatic dysfunction of thoracic region: Secondary | ICD-10-CM | POA: Diagnosis not present

## 2022-03-31 DIAGNOSIS — M9906 Segmental and somatic dysfunction of lower extremity: Secondary | ICD-10-CM | POA: Diagnosis not present

## 2022-03-31 DIAGNOSIS — M9901 Segmental and somatic dysfunction of cervical region: Secondary | ICD-10-CM | POA: Diagnosis not present

## 2022-03-31 DIAGNOSIS — M9907 Segmental and somatic dysfunction of upper extremity: Secondary | ICD-10-CM | POA: Diagnosis not present

## 2022-03-31 DIAGNOSIS — M9904 Segmental and somatic dysfunction of sacral region: Secondary | ICD-10-CM | POA: Diagnosis not present

## 2022-04-13 DIAGNOSIS — R69 Illness, unspecified: Secondary | ICD-10-CM | POA: Diagnosis not present

## 2022-04-13 DIAGNOSIS — M9904 Segmental and somatic dysfunction of sacral region: Secondary | ICD-10-CM | POA: Diagnosis not present

## 2022-04-13 DIAGNOSIS — M9907 Segmental and somatic dysfunction of upper extremity: Secondary | ICD-10-CM | POA: Diagnosis not present

## 2022-04-13 DIAGNOSIS — M9906 Segmental and somatic dysfunction of lower extremity: Secondary | ICD-10-CM | POA: Diagnosis not present

## 2022-04-13 DIAGNOSIS — M9901 Segmental and somatic dysfunction of cervical region: Secondary | ICD-10-CM | POA: Diagnosis not present

## 2022-04-13 DIAGNOSIS — M9902 Segmental and somatic dysfunction of thoracic region: Secondary | ICD-10-CM | POA: Diagnosis not present

## 2022-04-14 DIAGNOSIS — M25552 Pain in left hip: Secondary | ICD-10-CM | POA: Diagnosis not present

## 2022-04-14 DIAGNOSIS — M533 Sacrococcygeal disorders, not elsewhere classified: Secondary | ICD-10-CM | POA: Diagnosis not present

## 2022-04-14 DIAGNOSIS — R42 Dizziness and giddiness: Secondary | ICD-10-CM | POA: Diagnosis not present

## 2022-04-27 DIAGNOSIS — M9907 Segmental and somatic dysfunction of upper extremity: Secondary | ICD-10-CM | POA: Diagnosis not present

## 2022-04-27 DIAGNOSIS — M9902 Segmental and somatic dysfunction of thoracic region: Secondary | ICD-10-CM | POA: Diagnosis not present

## 2022-04-27 DIAGNOSIS — M9904 Segmental and somatic dysfunction of sacral region: Secondary | ICD-10-CM | POA: Diagnosis not present

## 2022-04-27 DIAGNOSIS — R69 Illness, unspecified: Secondary | ICD-10-CM | POA: Diagnosis not present

## 2022-04-27 DIAGNOSIS — M9901 Segmental and somatic dysfunction of cervical region: Secondary | ICD-10-CM | POA: Diagnosis not present

## 2022-04-27 DIAGNOSIS — M9906 Segmental and somatic dysfunction of lower extremity: Secondary | ICD-10-CM | POA: Diagnosis not present

## 2022-05-18 DIAGNOSIS — Z0100 Encounter for examination of eyes and vision without abnormal findings: Secondary | ICD-10-CM | POA: Diagnosis not present

## 2022-05-21 DIAGNOSIS — M9904 Segmental and somatic dysfunction of sacral region: Secondary | ICD-10-CM | POA: Diagnosis not present

## 2022-05-21 DIAGNOSIS — M9902 Segmental and somatic dysfunction of thoracic region: Secondary | ICD-10-CM | POA: Diagnosis not present

## 2022-05-21 DIAGNOSIS — Z0001 Encounter for general adult medical examination with abnormal findings: Secondary | ICD-10-CM | POA: Diagnosis not present

## 2022-05-21 DIAGNOSIS — M9901 Segmental and somatic dysfunction of cervical region: Secondary | ICD-10-CM | POA: Diagnosis not present

## 2022-05-21 DIAGNOSIS — M9906 Segmental and somatic dysfunction of lower extremity: Secondary | ICD-10-CM | POA: Diagnosis not present

## 2022-05-21 DIAGNOSIS — M9907 Segmental and somatic dysfunction of upper extremity: Secondary | ICD-10-CM | POA: Diagnosis not present

## 2022-05-26 DIAGNOSIS — L600 Ingrowing nail: Secondary | ICD-10-CM | POA: Diagnosis not present

## 2022-06-09 DIAGNOSIS — K219 Gastro-esophageal reflux disease without esophagitis: Secondary | ICD-10-CM | POA: Diagnosis not present

## 2022-06-09 DIAGNOSIS — E890 Postprocedural hypothyroidism: Secondary | ICD-10-CM | POA: Diagnosis not present

## 2022-06-09 DIAGNOSIS — R7989 Other specified abnormal findings of blood chemistry: Secondary | ICD-10-CM | POA: Diagnosis not present

## 2022-06-09 DIAGNOSIS — Z1212 Encounter for screening for malignant neoplasm of rectum: Secondary | ICD-10-CM | POA: Diagnosis not present

## 2022-06-09 DIAGNOSIS — E785 Hyperlipidemia, unspecified: Secondary | ICD-10-CM | POA: Diagnosis not present

## 2022-06-09 DIAGNOSIS — M8580 Other specified disorders of bone density and structure, unspecified site: Secondary | ICD-10-CM | POA: Diagnosis not present

## 2022-06-16 DIAGNOSIS — Z1331 Encounter for screening for depression: Secondary | ICD-10-CM | POA: Diagnosis not present

## 2022-06-16 DIAGNOSIS — M8580 Other specified disorders of bone density and structure, unspecified site: Secondary | ICD-10-CM | POA: Diagnosis not present

## 2022-06-16 DIAGNOSIS — J45909 Unspecified asthma, uncomplicated: Secondary | ICD-10-CM | POA: Diagnosis not present

## 2022-06-16 DIAGNOSIS — D126 Benign neoplasm of colon, unspecified: Secondary | ICD-10-CM | POA: Diagnosis not present

## 2022-06-16 DIAGNOSIS — K219 Gastro-esophageal reflux disease without esophagitis: Secondary | ICD-10-CM | POA: Diagnosis not present

## 2022-06-16 DIAGNOSIS — Z0000 Encounter for general adult medical examination without abnormal findings: Secondary | ICD-10-CM | POA: Diagnosis not present

## 2022-06-16 DIAGNOSIS — R82998 Other abnormal findings in urine: Secondary | ICD-10-CM | POA: Diagnosis not present

## 2022-06-16 DIAGNOSIS — E785 Hyperlipidemia, unspecified: Secondary | ICD-10-CM | POA: Diagnosis not present

## 2022-06-16 DIAGNOSIS — L80 Vitiligo: Secondary | ICD-10-CM | POA: Diagnosis not present

## 2022-06-16 DIAGNOSIS — C73 Malignant neoplasm of thyroid gland: Secondary | ICD-10-CM | POA: Diagnosis not present

## 2022-06-16 DIAGNOSIS — R251 Tremor, unspecified: Secondary | ICD-10-CM | POA: Diagnosis not present

## 2022-06-16 DIAGNOSIS — Z1339 Encounter for screening examination for other mental health and behavioral disorders: Secondary | ICD-10-CM | POA: Diagnosis not present

## 2022-06-16 DIAGNOSIS — E890 Postprocedural hypothyroidism: Secondary | ICD-10-CM | POA: Diagnosis not present

## 2022-07-06 ENCOUNTER — Other Ambulatory Visit: Payer: Self-pay | Admitting: Endocrinology

## 2022-07-06 DIAGNOSIS — Z1231 Encounter for screening mammogram for malignant neoplasm of breast: Secondary | ICD-10-CM

## 2022-07-15 DIAGNOSIS — E890 Postprocedural hypothyroidism: Secondary | ICD-10-CM | POA: Diagnosis not present

## 2022-07-15 DIAGNOSIS — E785 Hyperlipidemia, unspecified: Secondary | ICD-10-CM | POA: Diagnosis not present

## 2022-08-20 ENCOUNTER — Ambulatory Visit
Admission: RE | Admit: 2022-08-20 | Discharge: 2022-08-20 | Disposition: A | Discharge status: Home / Self Care | Payer: Medicare HMO | Source: Ambulatory Visit | Attending: Endocrinology | Admitting: Endocrinology

## 2022-08-20 DIAGNOSIS — Z1231 Encounter for screening mammogram for malignant neoplasm of breast: Secondary | ICD-10-CM

## 2022-09-15 ENCOUNTER — Encounter: Payer: Self-pay | Admitting: Internal Medicine

## 2022-11-25 DIAGNOSIS — D485 Neoplasm of uncertain behavior of skin: Secondary | ICD-10-CM | POA: Diagnosis not present

## 2023-01-11 DIAGNOSIS — L905 Scar conditions and fibrosis of skin: Secondary | ICD-10-CM | POA: Diagnosis not present

## 2023-01-11 DIAGNOSIS — L80 Vitiligo: Secondary | ICD-10-CM | POA: Diagnosis not present

## 2023-01-18 DIAGNOSIS — J069 Acute upper respiratory infection, unspecified: Secondary | ICD-10-CM | POA: Diagnosis not present

## 2023-01-18 DIAGNOSIS — E890 Postprocedural hypothyroidism: Secondary | ICD-10-CM | POA: Diagnosis not present

## 2023-01-18 DIAGNOSIS — R051 Acute cough: Secondary | ICD-10-CM | POA: Diagnosis not present

## 2023-01-24 DIAGNOSIS — J45909 Unspecified asthma, uncomplicated: Secondary | ICD-10-CM | POA: Diagnosis not present

## 2023-01-24 DIAGNOSIS — J029 Acute pharyngitis, unspecified: Secondary | ICD-10-CM | POA: Diagnosis not present

## 2023-01-24 DIAGNOSIS — R5383 Other fatigue: Secondary | ICD-10-CM | POA: Diagnosis not present

## 2023-01-24 DIAGNOSIS — G43909 Migraine, unspecified, not intractable, without status migrainosus: Secondary | ICD-10-CM | POA: Diagnosis not present

## 2023-01-24 DIAGNOSIS — R490 Dysphonia: Secondary | ICD-10-CM | POA: Diagnosis not present

## 2023-01-24 DIAGNOSIS — R051 Acute cough: Secondary | ICD-10-CM | POA: Diagnosis not present

## 2023-01-24 DIAGNOSIS — R0981 Nasal congestion: Secondary | ICD-10-CM | POA: Diagnosis not present

## 2023-01-24 DIAGNOSIS — Z1152 Encounter for screening for COVID-19: Secondary | ICD-10-CM | POA: Diagnosis not present

## 2023-01-26 ENCOUNTER — Inpatient Hospital Stay (HOSPITAL_COMMUNITY)
Admission: EM | Admit: 2023-01-26 | Discharge: 2023-01-29 | DRG: 193 | Disposition: A | Discharge status: Home / Self Care | Payer: Medicare HMO | Attending: Family Medicine | Admitting: Family Medicine

## 2023-01-26 ENCOUNTER — Emergency Department (HOSPITAL_COMMUNITY): Payer: Medicare HMO

## 2023-01-26 ENCOUNTER — Encounter (HOSPITAL_COMMUNITY): Payer: Self-pay | Admitting: Emergency Medicine

## 2023-01-26 ENCOUNTER — Other Ambulatory Visit: Payer: Self-pay

## 2023-01-26 DIAGNOSIS — J9601 Acute respiratory failure with hypoxia: Secondary | ICD-10-CM | POA: Diagnosis present

## 2023-01-26 DIAGNOSIS — J42 Unspecified chronic bronchitis: Secondary | ICD-10-CM | POA: Diagnosis not present

## 2023-01-26 DIAGNOSIS — I700 Atherosclerosis of aorta: Secondary | ICD-10-CM | POA: Diagnosis not present

## 2023-01-26 DIAGNOSIS — Z881 Allergy status to other antibiotic agents status: Secondary | ICD-10-CM

## 2023-01-26 DIAGNOSIS — R030 Elevated blood-pressure reading, without diagnosis of hypertension: Secondary | ICD-10-CM | POA: Diagnosis present

## 2023-01-26 DIAGNOSIS — Z66 Do not resuscitate: Secondary | ICD-10-CM | POA: Diagnosis present

## 2023-01-26 DIAGNOSIS — K219 Gastro-esophageal reflux disease without esophagitis: Secondary | ICD-10-CM | POA: Diagnosis present

## 2023-01-26 DIAGNOSIS — Z8249 Family history of ischemic heart disease and other diseases of the circulatory system: Secondary | ICD-10-CM

## 2023-01-26 DIAGNOSIS — Z9109 Other allergy status, other than to drugs and biological substances: Secondary | ICD-10-CM

## 2023-01-26 DIAGNOSIS — R0602 Shortness of breath: Secondary | ICD-10-CM | POA: Diagnosis not present

## 2023-01-26 DIAGNOSIS — J157 Pneumonia due to Mycoplasma pneumoniae: Secondary | ICD-10-CM | POA: Diagnosis not present

## 2023-01-26 DIAGNOSIS — J189 Pneumonia, unspecified organism: Secondary | ICD-10-CM | POA: Diagnosis not present

## 2023-01-26 DIAGNOSIS — E890 Postprocedural hypothyroidism: Secondary | ICD-10-CM | POA: Diagnosis present

## 2023-01-26 DIAGNOSIS — J383 Other diseases of vocal cords: Secondary | ICD-10-CM | POA: Diagnosis present

## 2023-01-26 DIAGNOSIS — Z79890 Hormone replacement therapy (postmenopausal): Secondary | ICD-10-CM

## 2023-01-26 DIAGNOSIS — Z888 Allergy status to other drugs, medicaments and biological substances status: Secondary | ICD-10-CM

## 2023-01-26 DIAGNOSIS — Z79899 Other long term (current) drug therapy: Secondary | ICD-10-CM

## 2023-01-26 DIAGNOSIS — Z85850 Personal history of malignant neoplasm of thyroid: Secondary | ICD-10-CM

## 2023-01-26 DIAGNOSIS — J45909 Unspecified asthma, uncomplicated: Secondary | ICD-10-CM | POA: Diagnosis present

## 2023-01-26 DIAGNOSIS — Z8349 Family history of other endocrine, nutritional and metabolic diseases: Secondary | ICD-10-CM

## 2023-01-26 DIAGNOSIS — Z91018 Allergy to other foods: Secondary | ICD-10-CM

## 2023-01-26 DIAGNOSIS — R918 Other nonspecific abnormal finding of lung field: Secondary | ICD-10-CM | POA: Diagnosis not present

## 2023-01-26 DIAGNOSIS — E039 Hypothyroidism, unspecified: Secondary | ICD-10-CM | POA: Insufficient documentation

## 2023-01-26 DIAGNOSIS — K589 Irritable bowel syndrome without diarrhea: Secondary | ICD-10-CM | POA: Diagnosis present

## 2023-01-26 DIAGNOSIS — R059 Cough, unspecified: Secondary | ICD-10-CM | POA: Diagnosis not present

## 2023-01-26 DIAGNOSIS — E876 Hypokalemia: Secondary | ICD-10-CM | POA: Diagnosis present

## 2023-01-26 DIAGNOSIS — T380X5A Adverse effect of glucocorticoids and synthetic analogues, initial encounter: Secondary | ICD-10-CM | POA: Diagnosis present

## 2023-01-26 DIAGNOSIS — Z1152 Encounter for screening for COVID-19: Secondary | ICD-10-CM

## 2023-01-26 DIAGNOSIS — J984 Other disorders of lung: Secondary | ICD-10-CM | POA: Diagnosis not present

## 2023-01-26 LAB — CBC WITH DIFFERENTIAL/PLATELET
Abs Immature Granulocytes: 0.09 10*3/uL — ABNORMAL HIGH (ref 0.00–0.07)
Basophils Absolute: 0.1 10*3/uL (ref 0.0–0.1)
Basophils Relative: 0 %
Eosinophils Absolute: 0.1 10*3/uL (ref 0.0–0.5)
Eosinophils Relative: 0 %
HCT: 45.6 % (ref 36.0–46.0)
Hemoglobin: 14.8 g/dL (ref 12.0–15.0)
Immature Granulocytes: 1 %
Lymphocytes Relative: 8 %
Lymphs Abs: 1.4 10*3/uL (ref 0.7–4.0)
MCH: 28.1 pg (ref 26.0–34.0)
MCHC: 32.5 g/dL (ref 30.0–36.0)
MCV: 86.5 fL (ref 80.0–100.0)
Monocytes Absolute: 1.1 10*3/uL — ABNORMAL HIGH (ref 0.1–1.0)
Monocytes Relative: 7 %
Neutro Abs: 14.1 10*3/uL — ABNORMAL HIGH (ref 1.7–7.7)
Neutrophils Relative %: 84 %
Platelets: 416 10*3/uL — ABNORMAL HIGH (ref 150–400)
RBC: 5.27 MIL/uL — ABNORMAL HIGH (ref 3.87–5.11)
RDW: 13.7 % (ref 11.5–15.5)
WBC: 16.8 10*3/uL — ABNORMAL HIGH (ref 4.0–10.5)
nRBC: 0 % (ref 0.0–0.2)

## 2023-01-26 LAB — COMPREHENSIVE METABOLIC PANEL
ALT: 37 U/L (ref 0–44)
AST: 48 U/L — ABNORMAL HIGH (ref 15–41)
Albumin: 3.6 g/dL (ref 3.5–5.0)
Alkaline Phosphatase: 109 U/L (ref 38–126)
Anion gap: 14 (ref 5–15)
BUN: 5 mg/dL — ABNORMAL LOW (ref 8–23)
CO2: 24 mmol/L (ref 22–32)
Calcium: 8.9 mg/dL (ref 8.9–10.3)
Chloride: 98 mmol/L (ref 98–111)
Creatinine, Ser: 0.51 mg/dL (ref 0.44–1.00)
GFR, Estimated: >60 mL/min (ref >60)
Glucose, Bld: 104 mg/dL — ABNORMAL HIGH (ref 70–99)
Potassium: 3.4 mmol/L — ABNORMAL LOW (ref 3.5–5.1)
Sodium: 136 mmol/L (ref 135–145)
Total Bilirubin: 0.9 mg/dL (ref 0.3–1.2)
Total Protein: 8 g/dL (ref 6.5–8.1)

## 2023-01-26 LAB — D-DIMER, QUANTITATIVE: D-Dimer, Quant: 0.87 ug{FEU}/mL — ABNORMAL HIGH (ref 0.00–0.50)

## 2023-01-26 LAB — SARS CORONAVIRUS 2 BY RT PCR: SARS Coronavirus 2 by RT PCR: NEGATIVE

## 2023-01-26 LAB — BRAIN NATRIURETIC PEPTIDE: B Natriuretic Peptide: 82.9 pg/mL (ref 0.0–100.0)

## 2023-01-26 MED ORDER — SODIUM CHLORIDE 0.9 % IV SOLN
1.0000 g | Freq: Once | INTRAVENOUS | Status: AC
Start: 2023-01-26 — End: 2023-01-27
  Administered 2023-01-26: 1 g via INTRAVENOUS
  Filled 2023-01-26: qty 10

## 2023-01-26 MED ORDER — IOHEXOL 350 MG/ML SOLN
80.0000 mL | Freq: Once | INTRAVENOUS | Status: AC | PRN
  Administered 2023-01-26: 80 mL via INTRAVENOUS

## 2023-01-26 MED ORDER — ALBUTEROL SULFATE HFA 108 (90 BASE) MCG/ACT IN AERS: 2.0000 | INHALATION_SPRAY | RESPIRATORY_TRACT | Status: DC | PRN

## 2023-01-26 MED ORDER — DEXAMETHASONE SODIUM PHOSPHATE 10 MG/ML IJ SOLN
10.0000 mg | Freq: Once | INTRAMUSCULAR | Status: AC
  Administered 2023-01-26: 10 mg via INTRAVENOUS
  Filled 2023-01-26: qty 1

## 2023-01-26 MED ORDER — ONDANSETRON HCL 4 MG/2ML IJ SOLN
4.0000 mg | Freq: Once | INTRAMUSCULAR | Status: AC
  Administered 2023-01-26: 4 mg via INTRAVENOUS
  Filled 2023-01-26: qty 2

## 2023-01-26 MED ORDER — IPRATROPIUM-ALBUTEROL 0.5-2.5 (3) MG/3ML IN SOLN
3.0000 mL | RESPIRATORY_TRACT | Status: AC
  Administered 2023-01-26 (×3): 3 mL via RESPIRATORY_TRACT
  Filled 2023-01-26: qty 3
  Filled 2023-01-26: qty 6

## 2023-01-26 MED ORDER — SODIUM CHLORIDE 0.9 % IV SOLN
500.0000 mg | Freq: Once | INTRAVENOUS | Status: AC
Start: 2023-01-26 — End: 2023-01-27
  Administered 2023-01-27: 500 mg via INTRAVENOUS
  Filled 2023-01-26: qty 5

## 2023-01-26 NOTE — ED Notes (Signed)
Per ZOXWR@ [facility]- collection needed for lavender tube. [employer]

## 2023-01-26 NOTE — ED Provider Notes (Signed)
Kelly Ingram AT [facility] Provider Note   CSN: [id number] Arrival date & time: 01/26/23  1949     History {Add pertinent medical, surgical, social history, OB history to HPI:1} Chief Complaint  Patient presents with   Shortness of Breath    Kelly Ingram is a 72 y.o. female with PMH as listed below who presents with [facility] and cough x 2 weeks. 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 She states it was treated with an "implant to bring the nerve back to midline" which relieved her hoarseness. She states that she has a "restricted airway at baseline" because of this implant. She states that over the last several weeks she has had inflammation/congestion in her airway which has made it difficult to breathe. She cannot lie flat d/t shortness of breath. She states that since this started she has become very hoarse again. She has been seen by her PCP and given a steroid shot, tessalon pearls, promethazine, albuterol inhaler, and Zpack without relief. States that she has been unable to catch her breath since coughing this afternoon. She denies CP, wheezing, leg swelling. Endorses intermittent sweats. Does have history of asthma.   Past Medical History:  Diagnosis Date   Asthma    Cancer ([facility]) 2008   thyroid   Endometrial polyp 06/2013   benign   GERD (gastroesophageal reflux disease)    IBS (irritable bowel syndrome)    Seasonal allergies    Thyroid disease 2008   hx thyroid cancer--bilateral nodules       Home Medications Prior to Admission medications   Medication Sig Start Date End Date Taking? Authorizing Provider  Cholecalciferol (VITAMIN D3) 10 MCG (400 UNIT) CAPS Take 1 capsule by mouth daily.    [provider]  fexofenadine (ALLEGRA) 180 MG tablet Take 180 mg by mouth daily.    [provider]  hydrocortisone (ANUSOL-HC) 2.5 % rectal cream Place 1 application rectally  as needed.  02/09/18   [provider]  levothyroxine (SYNTHROID, LEVOTHROID) 125 MCG tablet Take 125 mcg by mouth daily before breakfast. One tablet daily 6 days a week    [provider]  Magnesium 100 MG CAPS Take 1 capsule by mouth daily.    [provider]  Misc Natural Products (JOINT HEALTH PO) Take 2 tablets by mouth daily.    [provider]  Multiple Vitamin (STRESSTABS PO) Take 1 tablet by mouth daily.    [provider]  Multiple Vitamins-Minerals (ZINC PO) Take by mouth.    [provider]  Probiotic Product (PROBIOTIC DAILY PO) Take 1 capsule by mouth daily.    [provider]  TURMERIC PO Take 1 tablet by mouth daily.     [provider]  valACYclovir (VALTREX) 500 MG tablet as needed. 12/28/17   [provider]      Allergies    Effexor [venlafaxine], Erythromycin, Gluten meal, and Adhesive [tape]    Review of Systems   Review of Systems  HENT:  Positive for rhinorrhea.    A 10 point review of systems was performed and is negative unless otherwise reported in HPI.  Physical Exam Updated Vital Signs BP (!) 183/86   Pulse 92   Temp 98.4 F (36.9 C)   Resp (!) 30   Ht 5\' 3"  (1.6 m)   Wt 70.8 kg   LMP 05/17/1998 Comment: pt had bleeding from polyp 2/15  SpO2 90%   BMI  27.65 kg/m  Physical Exam General: Normal appearing female, lying in bed.  HEENT: Sclera anicteric, MMM, trachea midline. Patient is hoarse. Clear oropharynx. Midline uvula and symmetric tonsillar pillars. Cardiology: RRR, no murmurs/rubs/gallops. BL radial and DP pulses equal bilaterally.  Resp: Normal respiratory rate and effort. CTAB, no wheezes, rhonchi, crackles. She has no stridor. Intermittently coughing. Abd: Soft, non-tender, non-distended. No rebound tenderness or guarding.  GU: Deferred. MSK: No peripheral edema or signs of trauma. Skin: warm, dry.  Neuro: A&Ox4, CNs II-XII grossly intact. MAEs. Sensation  grossly intact.  Psych: Normal mood and affect.   ED Results / Procedures / Treatments   Labs (all labs ordered are listed, but only abnormal results are displayed) Labs Reviewed  CBC WITH DIFFERENTIAL/PLATELET - Abnormal; Notable for the following components:      Result Value   WBC 16.8 (*)    RBC 5.27 (*)    Platelets 416 (*)    Neutro Abs 14.1 (*)    Monocytes Absolute 1.1 (*)    Abs Immature Granulocytes 0.09 (*)    All other components within normal limits  COMPREHENSIVE METABOLIC PANEL - Abnormal; Notable for the following components:   Potassium 3.4 (*)    Glucose, Bld 104 (*)    BUN 5 (*)    AST 48 (*)    All other components within normal limits  D-DIMER, QUANTITATIVE - Abnormal; Notable for the following components:   D-Dimer, Quant 0.87 (*)    All other components within normal limits  SARS CORONAVIRUS 2 BY RT PCR  BRAIN NATRIURETIC PEPTIDE    EKG None  Radiology DG Chest 2 View  Result Date: 01/26/2023 CLINICAL DATA:  Shortness of breath EXAM: CHEST - 2 VIEW COMPARISON:  None Available. FINDINGS: Faint right lung base densities may represent atelectasis or infiltrate. There is mild chronic bronchitic changes. No consolidative changes. There is no pleural effusion or pneumothorax. The cardiac silhouette is within normal limits. Atherosclerotic calcification of the aorta. No acute osseous pathology. IMPRESSION: Right lung base atelectasis or infiltrate. Electronically Signed   By: Elgie Collard M.D.   On: 01/26/2023 21:56    Procedures Procedures  {Document cardiac monitor, telemetry assessment procedure when appropriate:1}  Medications Ordered in ED Medications  iohexol (OMNIPAQUE) 350 MG/ML injection 80 mL (has no administration in time range)  ipratropium-albuterol (DUONEB) 0.5-2.5 (3) MG/3ML nebulizer solution 3 mL (3 mLs Nebulization Given 01/26/23 2142)  dexamethasone (DECADRON) injection 10 mg (10 mg Intravenous Given 01/26/23 2116)    ED Course/  Medical Decision Making/ A&P                          Medical Decision Making Amount and/or Complexity of Data Reviewed Labs: ordered. Decision-making details documented in ED Course. Radiology: ordered.  Risk Prescription drug management.    This patient presents to the ED for concern of SOB, this involves an extensive number of treatment options, and is a complaint that carries with it a high risk of complications and morbidity.  I considered the following differential and admission for this acute, potentially life threatening condition.   MDM:    Patient does not have stridor and has stable airway at this time. I cannot find record of what was done or what implant was placed in her neck. Consider airway inflammation as patient states she feels, will treat with decadron IV 10 mg as she states the steroids helped before. Consider epiglottitis, RPA, with her hoarseness and will  get CT scan to further evaluate her airway. She also endorses cough/SOB besides the upper airway symptoms, cannot PERC out d/t age, will get dimer and reassess. Consider PNA, bronchitis, viral URI such as covid.  ***  Clinical Course as of 01/26/23 2256  Wed Jan 26, 2023  2125 WBC(!): 16.8 +Leukocytosis but has had steroids recently [HN]  2125 D-Dimer, Quant(!): 0.87 +Dimer. Will get CT PE in addition to CT soft tissue neck w/ contrast for further evaluation. [HN]    Clinical Course User Index [HN] Loetta Rough, MD    Labs: I Ordered, and personally interpreted labs.  The pertinent results include:  those listed above  Imaging Studies ordered: CXR ordered from triage. I ordered CT PE, CT neck w/ contrast I independently visualized and interpreted imaging. I agree with the radiologist interpretation  Additional history obtained from chart review.    Reevaluation: After the interventions noted above, I reevaluated the patient and found that they have  :{resolved/improved/worsened:23923::"improved"}  Social Determinants of Health: Lives independently  Disposition:  ***  Co morbidities that complicate the patient evaluation  Past Medical History:  Diagnosis Date   Asthma    Cancer ([facility]) 2008   thyroid   Endometrial polyp 06/2013   benign   GERD (gastroesophageal reflux disease)    IBS (irritable bowel syndrome)    Seasonal allergies    Thyroid disease 2008   hx thyroid cancer--bilateral nodules     Medicines Meds ordered this encounter  Medications   DISCONTD: albuterol (VENTOLIN HFA) 108 (90 Base) MCG/ACT inhaler 2 puff   ipratropium-albuterol (DUONEB) 0.5-2.5 (3) MG/3ML nebulizer solution 3 mL   dexamethasone (DECADRON) injection 10 mg   iohexol (OMNIPAQUE) 350 MG/ML injection 80 mL    I have reviewed the patients home medicines and have made adjustments as needed  Problem List / ED Course: Problem List Items Addressed This Visit   None        {Document critical care time when appropriate:1} {Document review of labs and clinical decision tools ie heart score, Chads2Vasc2 etc:1}  {Document your independent review of radiology images, and any outside records:1} {Document your discussion with family members, caretakers, and with consultants:1} {Document social determinants of health affecting pt's care:1} {Document your decision making why or why not admission, treatments were needed:1}  This note was created using dictation software, which may contain spelling or grammatical errors.

## 2023-01-26 NOTE — ED Notes (Signed)
Pt placed on 2 L [place] for O2 @ 88% - 90% room air.

## 2023-01-26 NOTE — ED Triage Notes (Signed)
Pt c/o SHOB and cough x 2 weeks, has been seen by her PCP and given a steroid shot, tessalon pearls, promethazine, and albuterol inhaler without relief. States that she has been unable to catch her breath since coughing this afternoon.

## 2023-01-27 DIAGNOSIS — Z881 Allergy status to other antibiotic agents status: Secondary | ICD-10-CM | POA: Diagnosis not present

## 2023-01-27 DIAGNOSIS — K589 Irritable bowel syndrome without diarrhea: Secondary | ICD-10-CM | POA: Diagnosis not present

## 2023-01-27 DIAGNOSIS — K219 Gastro-esophageal reflux disease without esophagitis: Secondary | ICD-10-CM | POA: Diagnosis not present

## 2023-01-27 DIAGNOSIS — Z66 Do not resuscitate: Secondary | ICD-10-CM | POA: Diagnosis not present

## 2023-01-27 DIAGNOSIS — Z888 Allergy status to other drugs, medicaments and biological substances status: Secondary | ICD-10-CM | POA: Diagnosis not present

## 2023-01-27 DIAGNOSIS — E876 Hypokalemia: Secondary | ICD-10-CM

## 2023-01-27 DIAGNOSIS — J383 Other diseases of vocal cords: Secondary | ICD-10-CM | POA: Diagnosis not present

## 2023-01-27 DIAGNOSIS — Z8249 Family history of ischemic heart disease and other diseases of the circulatory system: Secondary | ICD-10-CM | POA: Diagnosis not present

## 2023-01-27 DIAGNOSIS — J45909 Unspecified asthma, uncomplicated: Secondary | ICD-10-CM | POA: Diagnosis not present

## 2023-01-27 DIAGNOSIS — J189 Pneumonia, unspecified organism: Secondary | ICD-10-CM

## 2023-01-27 DIAGNOSIS — Z1152 Encounter for screening for COVID-19: Secondary | ICD-10-CM | POA: Diagnosis not present

## 2023-01-27 DIAGNOSIS — Z9109 Other allergy status, other than to drugs and biological substances: Secondary | ICD-10-CM | POA: Diagnosis not present

## 2023-01-27 DIAGNOSIS — R030 Elevated blood-pressure reading, without diagnosis of hypertension: Secondary | ICD-10-CM | POA: Diagnosis not present

## 2023-01-27 DIAGNOSIS — Z8349 Family history of other endocrine, nutritional and metabolic diseases: Secondary | ICD-10-CM | POA: Diagnosis not present

## 2023-01-27 DIAGNOSIS — Z91018 Allergy to other foods: Secondary | ICD-10-CM | POA: Diagnosis not present

## 2023-01-27 DIAGNOSIS — Z79899 Other long term (current) drug therapy: Secondary | ICD-10-CM | POA: Diagnosis not present

## 2023-01-27 DIAGNOSIS — J9601 Acute respiratory failure with hypoxia: Secondary | ICD-10-CM | POA: Diagnosis not present

## 2023-01-27 DIAGNOSIS — Z85850 Personal history of malignant neoplasm of thyroid: Secondary | ICD-10-CM | POA: Diagnosis not present

## 2023-01-27 DIAGNOSIS — E890 Postprocedural hypothyroidism: Secondary | ICD-10-CM | POA: Diagnosis not present

## 2023-01-27 DIAGNOSIS — J157 Pneumonia due to Mycoplasma pneumoniae: Secondary | ICD-10-CM | POA: Diagnosis not present

## 2023-01-27 DIAGNOSIS — R0602 Shortness of breath: Secondary | ICD-10-CM | POA: Diagnosis not present

## 2023-01-27 DIAGNOSIS — T380X5A Adverse effect of glucocorticoids and synthetic analogues, initial encounter: Secondary | ICD-10-CM | POA: Diagnosis not present

## 2023-01-27 DIAGNOSIS — Z79890 Hormone replacement therapy (postmenopausal): Secondary | ICD-10-CM | POA: Diagnosis not present

## 2023-01-27 LAB — CBC
HCT: 41.2 % (ref 36.0–46.0)
Hemoglobin: 13.5 g/dL (ref 12.0–15.0)
MCH: 27.7 pg (ref 26.0–34.0)
MCHC: 32.8 g/dL (ref 30.0–36.0)
MCV: 84.6 fL (ref 80.0–100.0)
Platelets: 436 10*3/uL — ABNORMAL HIGH (ref 150–400)
RBC: 4.87 MIL/uL (ref 3.87–5.11)
RDW: 13.5 % (ref 11.5–15.5)
WBC: 17.2 10*3/uL — ABNORMAL HIGH (ref 4.0–10.5)
nRBC: 0 % (ref 0.0–0.2)

## 2023-01-27 LAB — RESPIRATORY PANEL BY PCR

## 2023-01-27 LAB — COMPREHENSIVE METABOLIC PANEL
ALT: 30 U/L (ref 0–44)
AST: 33 U/L (ref 15–41)
Albumin: 3.1 g/dL — ABNORMAL LOW (ref 3.5–5.0)
Alkaline Phosphatase: 98 U/L (ref 38–126)
Anion gap: 14 (ref 5–15)
BUN: 6 mg/dL — ABNORMAL LOW (ref 8–23)
CO2: 24 mmol/L (ref 22–32)
Calcium: 8.5 mg/dL — ABNORMAL LOW (ref 8.9–10.3)
Chloride: 97 mmol/L — ABNORMAL LOW (ref 98–111)
Creatinine, Ser: 0.61 mg/dL (ref 0.44–1.00)
GFR, Estimated: >60 mL/min (ref >60)
Glucose, Bld: 167 mg/dL — ABNORMAL HIGH (ref 70–99)
Potassium: 2.8 mmol/L — ABNORMAL LOW (ref 3.5–5.1)
Sodium: 135 mmol/L (ref 135–145)
Total Bilirubin: 0.4 mg/dL (ref 0.3–1.2)
Total Protein: 7.2 g/dL (ref 6.5–8.1)

## 2023-01-27 LAB — PROCALCITONIN: Procalcitonin: <0.1 ng/mL

## 2023-01-27 LAB — MAGNESIUM: Magnesium: 2 mg/dL (ref 1.7–2.4)

## 2023-01-27 MED ORDER — ACETAMINOPHEN 325 MG PO TABS: 650.0000 mg | ORAL_TABLET | Freq: Four times a day (QID) | ORAL | Status: DC | PRN

## 2023-01-27 MED ORDER — LEVOTHYROXINE SODIUM 25 MCG PO TABS
125.0000 ug | ORAL_TABLET | ORAL | Status: DC
  Administered 2023-01-27 – 2023-01-29 (×3): 125 ug via ORAL
  Filled 2023-01-27 (×3): qty 1

## 2023-01-27 MED ORDER — POTASSIUM CHLORIDE 20 MEQ PO PACK
40.0000 meq | PACK | Freq: Once | ORAL | Status: AC
  Administered 2023-01-27: 40 meq via ORAL
  Filled 2023-01-27: qty 2

## 2023-01-27 MED ORDER — LEVOTHYROXINE SODIUM 50 MCG PO TABS: 62.5000 ug | ORAL_TABLET | ORAL | Status: DC

## 2023-01-27 MED ORDER — GUAIFENESIN-DM 100-10 MG/5ML PO SYRP: 5.0000 mL | ORAL_SOLUTION | ORAL | Status: DC | PRN

## 2023-01-27 MED ORDER — SODIUM CHLORIDE 0.9 % IV SOLN
500.0000 mg | INTRAVENOUS | Status: DC
  Administered 2023-01-27 – 2023-01-29 (×2): 500 mg via INTRAVENOUS
  Filled 2023-01-27 (×2): qty 5

## 2023-01-27 MED ORDER — HYDROCOD POLI-CHLORPHE POLI ER 10-8 MG/5ML PO SUER: 5.0000 mL | Freq: Two times a day (BID) | ORAL | Status: DC | PRN

## 2023-01-27 MED ORDER — ENOXAPARIN SODIUM 40 MG/0.4ML IJ SOSY
40.0000 mg | PREFILLED_SYRINGE | INTRAMUSCULAR | Status: DC
  Administered 2023-01-27 – 2023-01-29 (×3): 40 mg via SUBCUTANEOUS
  Filled 2023-01-27 (×3): qty 0.4

## 2023-01-27 MED ORDER — SODIUM CHLORIDE 0.9 % IV SOLN
1.0000 g | INTRAVENOUS | Status: DC
  Administered 2023-01-27 – 2023-01-28 (×2): 1 g via INTRAVENOUS
  Filled 2023-01-27 (×2): qty 10

## 2023-01-27 MED ORDER — BENZONATATE 100 MG PO CAPS
100.0000 mg | ORAL_CAPSULE | Freq: Three times a day (TID) | ORAL | Status: DC | PRN
  Administered 2023-01-27 – 2023-01-28 (×2): 100 mg via ORAL
  Filled 2023-01-27 (×2): qty 1

## 2023-01-27 MED ORDER — FLUTICASONE PROPIONATE 50 MCG/ACT NA SUSP
1.0000 | Freq: Every day | NASAL | Status: DC
  Administered 2023-01-28: 1 via NASAL

## 2023-01-27 MED ORDER — BENZONATATE 100 MG PO CAPS: 100.0000 mg | ORAL_CAPSULE | Freq: Three times a day (TID) | ORAL | Status: DC | PRN

## 2023-01-27 MED ORDER — POTASSIUM CHLORIDE CRYS ER 20 MEQ PO TBCR
40.0000 meq | EXTENDED_RELEASE_TABLET | Freq: Once | ORAL | Status: AC
  Administered 2023-01-27: 40 meq via ORAL
  Filled 2023-01-27: qty 2

## 2023-01-27 MED ORDER — ACETAMINOPHEN 650 MG RE SUPP: 650.0000 mg | Freq: Four times a day (QID) | RECTAL | Status: DC | PRN

## 2023-01-27 MED ORDER — LORATADINE 10 MG PO TABS
10.0000 mg | ORAL_TABLET | Freq: Every day | ORAL | Status: DC
  Administered 2023-01-27 – 2023-01-29 (×3): 10 mg via ORAL
  Filled 2023-01-27 (×3): qty 1

## 2023-01-27 MED ORDER — FEZOLINETANT 45 MG PO TABS: 1.0000 | ORAL_TABLET | Freq: Every evening | ORAL | Status: DC

## 2023-01-27 MED ORDER — ALBUTEROL SULFATE (2.5 MG/3ML) 0.083% IN NEBU: 2.5000 mg | INHALATION_SOLUTION | Freq: Four times a day (QID) | RESPIRATORY_TRACT | Status: DC | PRN

## 2023-01-27 NOTE — Plan of Care (Signed)
  Problem: Education: Goal: Knowledge of General Education information will improve Description Including pain rating scale, medication(s)/side effects and non-pharmacologic comfort measures Outcome: Progressing   

## 2023-01-27 NOTE — Progress Notes (Signed)
PROGRESS NOTE    Kelly Ingram  HQI:[id number] DOB: 1951/01/24 DOA: 01/26/2023 PCP: Adrian Prince, MD   Brief Narrative: Kelly Ingram is a 72 y.o. female with a history of asthma, thyroid cancer status post total thyroidectomy with resultant hypothyroidism, vocal cord dysfunction status post vocal cord implant, GERD, IBS, seasonal allergies.  Patient presented secondary to shortness of breath and was found to have evidence of multifocal pneumonia concerning for atypical infection.  Patient was started on empiric treatment for community-acquired pneumonia with ceftriaxone and azithromycin.  Patient has recently been treated for pneumonia with azithromycin but did not complete her course.  COVID-19 testing was negative.  Respiratory virus panel was positive for mycoplasma pneumoniae.     Assessment/Plan:  Community-acquired pneumonia Patient with symptoms of cough with prior production in addition to initial hypoxia and bilateral infiltrates consistent with multifocal pneumonia.  Respiratory virus panel testing was positive for mycoplasma pneumoniae infection.  Patient was started empirically on ceftriaxone and azithromycin on admission.  Associated leukocytosis, however patient did get Decadron this admission.  Procalcitonin is undetectable.  Afebrile.  Cough is no longer productive. -Continue ceftriaxone and azithromycin -Droplet precautions  Acute respiratory failure with hypoxia Per documentation, patient with oxygen saturation as low as 85% on room air at rest.  Patient with associated dyspnea, especially with exertion.  Patient started on 2 L/min of oxygen per nasal cannula with improvement of symptoms.  Hypoxia secondary to pneumonia. -Wean to room air as able -Ambulatory pulse ox prior to discharge -Incentive spirometer -PT/OT  Hypothyroidism -Continue Synthroid 62.5 mcg daily  Seasonal allergies -Claritin (substituted for home Allegra) -Flonase  History  of vocal cord implant Patient reports feeling like her airway closes at times.  Patient is without stridor or other worrisome airway sounds.  Patient shows good oxygenation at this time.  No respiratory distress concerning for compromised airway.  CT soft tissue of the neck was obtained this admission and was negative for acute abnormality.  Patient received Decadron while in the emergency department.  Hypokalemia Mild at 3.4 on admission with worsening to 2.8 this morning patient.  Patient given potassium supplementation. -Further potassium supplementation -Potassium at in a.m.   DVT prophylaxis: Lovenox Code Status:   Code Status: Limited: Do not attempt resuscitation (DNR) -DNR-LIMITED -Do Not Intubate/DNI  Family Communication: None at bedside Disposition Plan: Discharge likely in 24 hours pending ability to wean oxygen to room air and improvement in functional capabilities   Consultants:  None  Procedures:  None  Antimicrobials: Ceftriaxone IV Azithromycin IV   Subjective: Patient reports continued cough.  While ambulation attempted, patient developed some shortness of breath.  She attempted to ambulate without oxygen but felt she required oxygen when she had some trouble breathing.  Afebrile.  Objective: BP (!) 164/137   Pulse 78   Temp 97.9 F (36.6 C) (Oral)   Resp 20   Ht 5\' 3"  (1.6 m)   Wt 70.8 kg   LMP 05/17/1998 Comment: pt had bleeding from polyp 2/15  SpO2 97%   BMI 27.65 kg/m   Examination:  General exam: Appears calm and comfortable Respiratory system: Bibasilar Rales. Respiratory effort normal. Cardiovascular system: S1 & S2 heard, RRR. No murmurs, rubs, gallops or clicks. Gastrointestinal system: Abdomen is nondistended, soft and nontender. No organomegaly or masses felt. Normal bowel sounds heard. Central nervous system: Alert and oriented. No focal neurological deficits. Musculoskeletal: No edema. No calf tenderness Skin: No cyanosis. No  rashes Psychiatry: Judgement and insight appear normal.  Mood & affect appropriate.    Data Reviewed: I have personally reviewed following labs and imaging studies   Last CBC Lab Results  Component Value Date   WBC 17.2 (H) 01/27/2023   HGB 13.5 01/27/2023   HCT 41.2 01/27/2023   MCV 84.6 01/27/2023   MCH 27.7 01/27/2023   RDW 13.5 01/27/2023   PLT 436 (H) 01/27/2023     Last metabolic panel Lab Results  Component Value Date   GLUCOSE 167 (H) 01/27/2023   NA 135 01/27/2023   K 2.8 (L) 01/27/2023   CL 97 (L) 01/27/2023   CO2 24 01/27/2023   BUN 6 (L) 01/27/2023   CREATININE 0.61 01/27/2023   GFRNONAA >60 01/27/2023   CALCIUM 8.5 (L) 01/27/2023   PROT 7.2 01/27/2023   ALBUMIN 3.1 (L) 01/27/2023   BILITOT 0.4 01/27/2023   ALKPHOS 98 01/27/2023   AST 33 01/27/2023   ALT 30 01/27/2023   ANIONGAP 14 01/27/2023     Creatinine Clearance: Estimated Creatinine Clearance: 58.2 mL/min (by C-G formula based on SCr of 0.61 mg/dL).  Recent Results (from the past 240 hour(s))  SARS Coronavirus 2 by RT PCR (hospital order, performed in [facility] hospital lab) *cepheid single result test* Anterior Nasal Swab     Status: None   Collection Time: 01/26/23  8:32 PM   Specimen: Anterior Nasal Swab  Result Value Ref Range Status   SARS Coronavirus 2 by RT PCR NEGATIVE NEGATIVE Final    Comment: (NOTE) SARS-CoV-2 target nucleic acids are NOT DETECTED.  The SARS-CoV-2 RNA is generally detectable in upper and lower respiratory specimens during the acute phase of infection. The lowest concentration of SARS-CoV-2 viral copies this assay can detect is 250 copies / mL. A negative result does not preclude SARS-CoV-2 infection and should not be used as the sole basis for treatment or other patient management decisions.  A negative result may occur with improper specimen collection / handling, submission of specimen other than nasopharyngeal swab, presence of viral mutation(s) within  the areas targeted by this assay, and inadequate number of viral copies (<250 copies / mL). A negative result must be combined with clinical observations, patient history, and epidemiological information.  Fact Sheet for Patients:   RoadLapTop.co.za  Fact Sheet for Healthcare Providers: http://kim-miller.com/  This test is not yet approved or  cleared by the [place] FDA and has been authorized for detection and/or diagnosis of SARS-CoV-2 by FDA under an Emergency Use Authorization (EUA).  This EUA will remain in effect (meaning this test can be used) for the duration of the COVID-19 declaration under Section 564(b)(1) of the Act, 21 U.S.C. section 360bbb-3(b)(1), unless the authorization is terminated or revoked sooner.  Performed at [facility], [address] [place], [place] [address]   Respiratory (~20 pathogens) panel by PCR     Status: Abnormal   Collection Time: 01/27/23  2:39 AM   Specimen: Nasopharyngeal Swab; Respiratory  Result Value Ref Range Status   Adenovirus NOT DETECTED NOT DETECTED Final   Coronavirus 229E NOT DETECTED NOT DETECTED Final    Comment: (NOTE) The Coronavirus on the Respiratory Panel, DOES NOT test for the novel  Coronavirus (2019 nCoV)    Coronavirus HKU1 NOT DETECTED NOT DETECTED Final   Coronavirus NL63 NOT DETECTED NOT DETECTED Final   Coronavirus OC43 NOT DETECTED NOT DETECTED Final   Metapneumovirus NOT DETECTED NOT DETECTED Final   Rhinovirus / Enterovirus NOT DETECTED NOT DETECTED Final   Influenza A NOT  DETECTED NOT DETECTED Final   Influenza B NOT DETECTED NOT DETECTED Final   Parainfluenza Virus 1 NOT DETECTED NOT DETECTED Final   Parainfluenza Virus 2 NOT DETECTED NOT DETECTED Final   Parainfluenza Virus 3 NOT DETECTED NOT DETECTED Final   Parainfluenza Virus 4 NOT DETECTED NOT DETECTED Final   Respiratory Syncytial Virus NOT DETECTED NOT DETECTED Final    Bordetella pertussis NOT DETECTED NOT DETECTED Final   Bordetella Parapertussis NOT DETECTED NOT DETECTED Final   Chlamydophila pneumoniae NOT DETECTED NOT DETECTED Final   Mycoplasma pneumoniae DETECTED (A) NOT DETECTED Final    Comment: Performed at [facility], [address] [place], [place] [address]      Radiology Studies: CT Soft Tissue Neck W Contrast  Result Date: 01/26/2023 CLINICAL DATA:  Cough with possible soft tissue infection of the neck. EXAM: CT NECK WITH CONTRAST TECHNIQUE: Multidetector CT imaging of the neck was performed using the standard protocol following the bolus administration of intravenous contrast. RADIATION DOSE REDUCTION: This exam was performed according to the departmental dose-optimization program which includes automated exposure control, adjustment of the mA and/or kV according to patient size and/or use of iterative reconstruction technique. CONTRAST:  80mL OMNIPAQUE IOHEXOL 350 MG/ML SOLN COMPARISON:  None Available. FINDINGS: Pharynx and larynx: Normal. No mass or swelling. Salivary glands: No inflammation, mass, or stone. Thyroid: Surgically absent Lymph nodes: No enlarged or abnormal density lymph nodes. Vascular: Negative Limited intracranial: Normal Visualized orbits: Normal Mastoids and visualized paranasal sinuses: Clear. Skeleton: No acute or aggressive process. Upper chest: Negative. Other: None. IMPRESSION: 1. No acute abnormality of the neck. 2. Status post thyroidectomy. Electronically Signed   By: Deatra Robinson M.D.   On: 01/26/2023 23:33   CT Angio Chest PE W and/or Wo Contrast  Result Date: 01/26/2023 CLINICAL DATA:  Cough and shortness of breath.  Positive D-dimer. EXAM: CT ANGIOGRAPHY CHEST WITH CONTRAST TECHNIQUE: Multidetector CT imaging of the chest was performed using the standard protocol during bolus administration of intravenous contrast. Multiplanar CT image reconstructions and MIPs were obtained to evaluate the vascular anatomy.  RADIATION DOSE REDUCTION: This exam was performed according to the departmental dose-optimization program which includes automated exposure control, adjustment of the mA and/or kV according to patient size and/or use of iterative reconstruction technique. CONTRAST:  80mL OMNIPAQUE IOHEXOL 350 MG/ML SOLN COMPARISON:  None Available. FINDINGS: Cardiovascular: There is adequate opacification of the pulmonary arteries to the segmental level. There is limited evaluation in the lower lungs, particularly on the right, secondary to respiratory motion artifact. There is no definite pulmonary embolism. Mediastinum/Nodes: Thyroid gland is surgically absent. There are no enlarged lymph nodes identified. There some prominent, but nonenlarged right hilar lymph nodes present. Esophagus is unremarkable. Lungs/Pleura: Tree-in-bud opacities are seen minimally in the inferior right upper lobe scattered throughout the right lower lobe. There are additional patchy ground-glass and airspace opacities in the bilateral lower lobes, right greater than left, and minimally in the right middle lobe. No pleural effusion or pneumothorax. Upper Abdomen: No acute abnormality. Musculoskeletal: No chest wall abnormality. No acute or significant osseous findings. Review of the MIP images confirms the above findings. IMPRESSION: 1. Limited evaluation of the lower lungs secondary to respiratory motion artifact. No definite pulmonary embolism. 2. Tree-in-bud opacities in the right upper lobe and right lower lobe with additional patchy ground-glass and airspace opacities in the bilateral lower lobes, right greater than left, and minimally in the right middle lobe. Findings are most compatible with multifocal pneumonia.  Recommend follow-up imaging to resolution. 3. Prominent, but nonenlarged right hilar lymph nodes are likely reactive. Electronically Signed   By: Darliss Cheney M.D.   On: 01/26/2023 23:27   DG Chest 2 View  Result Date:  01/26/2023 CLINICAL DATA:  Shortness of breath EXAM: CHEST - 2 VIEW COMPARISON:  None Available. FINDINGS: Faint right lung base densities may represent atelectasis or infiltrate. There is mild chronic bronchitic changes. No consolidative changes. There is no pleural effusion or pneumothorax. The cardiac silhouette is within normal limits. Atherosclerotic calcification of the aorta. No acute osseous pathology. IMPRESSION: Right lung base atelectasis or infiltrate. Electronically Signed   By: Elgie Collard M.D.   On: 01/26/2023 21:56      LOS: 0 days    Jacquelin Hawking, MD [facility] 01/27/2023, 1:20 PM   If 7PM-7AM, please contact night-coverage [email or web address]

## 2023-01-27 NOTE — ED Notes (Signed)
Pt ambulated to bathroom and back on room air, sats remained at 94% while walking and room air.

## 2023-01-27 NOTE — ED Notes (Signed)
Pt was taken off oxygen at 1130, o2 sats remained 94-96%, however pt began coughing and requested oxygen back on, stating that not having oxygen makes her cough.

## 2023-01-27 NOTE — ED Notes (Signed)
ED TO INPATIENT HANDOFF REPORT  ED Nurse Name and Phone #: Delice Bison, RN  S Name/Age/Gender Kelly Ingram 72 y.o. female Room/Bed: WA15/WA15  Code Status   Code Status: Limited: Do not attempt resuscitation (DNR) -DNR-LIMITED -Do Not Intubate/DNI   Home/SNF/Other Home Patient oriented to: self, place, time, and situation Is this baseline? Yes   Triage Complete: Triage complete  Chief Complaint Multifocal pneumonia [J18.9]  Triage Note Pt c/o SHOB and cough x 2 weeks, has been seen by her PCP and given a steroid shot, tessalon pearls, promethazine, and albuterol inhaler without relief. States that she has been unable to catch her breath since coughing this afternoon.    Allergies Allergies  Allergen Reactions   Effexor [Venlafaxine] Other (See Comments)    Cognitive change, tremors. - July 30, 2013.   Erythromycin Nausea Only    GI upset.   Gluten Meal     Other reaction(s): Diarrhea, Headache   Adhesive [Tape]     Level of Care/Admitting Diagnosis ED Disposition     ED Disposition  Admit   Condition  --   Comment  Hospital Area: [facility] [[id number]]  Level of Care: Progressive [102]  Admit to Progressive based on following criteria: RESPIRATORY PROBLEMS hypoxemic/hypercapnic respiratory failure that is responsive to NIPPV (BiPAP) or High Flow Nasal Cannula (6-80 lpm). Frequent assessment/intervention, no > Q2 hrs < Q4 hrs, to maintain oxygenation and pulmonary hygiene.  May admit patient to [facility] or [facility] if equivalent level of care is available:: Yes  Covid Evaluation: Asymptomatic - no recent exposure (last 10 days) testing not required  Diagnosis: Multifocal pneumonia [1308657]  Admitting Physician: John Giovanni [[id number]]  Attending Physician: John Giovanni [[id number]]  Certification:: I certify this patient will need inpatient services for at least 2 midnights  Expected Medical Readiness: 01/30/2023           B Medical/Surgery History Past Medical History:  Diagnosis Date   Asthma    Cancer (HCC) 2008   thyroid   Endometrial polyp 06/2013   benign   GERD (gastroesophageal reflux disease)    IBS (irritable bowel syndrome)    Seasonal allergies    Thyroid disease 2008   hx thyroid cancer--bilateral nodules   Past Surgical History:  Procedure Laterality Date   COLONOSCOPY     12 yrs ago-normal   DILATATION & CURRETTAGE/HYSTEROSCOPY WITH RESECTOCOPE N/A 07/03/2013   Procedure: DILATATION & CURETTAGE/HYSTEROSCOPY WITH RESECTOCOPE;  Surgeon: Jacqualin Combes de Gwenevere Ghazi, MD;  Location: [facility];  Service: Gynecology;  Laterality: N/A;   DILATION AND CURETTAGE OF UTERUS     SHOULDER SURGERY Left    TOTAL THYROIDECTOMY  2008   vocal cord implant Right 2009     A IV Location/Drains/Wounds Patient Lines/Drains/Airways Status     Active Line/Drains/Airways     Name Placement date Placement time Site Days   Peripheral IV 01/26/23 22 G Posterior;Right Hand 01/26/23  2100  Hand  1   Peripheral IV 01/26/23 20 G 2.5" Right;Upper Arm 01/26/23  2252  Arm  1   Incision (Closed) 07/03/13 Perineum 07/03/13  1130  -- 3495   Incision (Closed) 07/03/13 Abdomen 07/03/13  1130  -- 3495   Incision - 3 Ports Abdomen 1: Umbilicus 2: Right;Medial;Lateral 3: Left;Medial;Lateral 07/03/13  1131  -- 3495            Intake/Output Last 24 hours No intake or output data in the 24 hours ending 01/27/23 1609  Labs/Imaging  Results for orders placed or performed during the hospital encounter of 01/26/23 (from the past 48 hour(s))  SARS Coronavirus 2 by RT PCR (hospital order, performed in [facility] hospital lab) *cepheid single result test* Anterior Nasal Swab     Status: None   Collection Time: 01/26/23  8:32 PM   Specimen: Anterior Nasal Swab  Result Value Ref Range   SARS Coronavirus 2 by RT PCR NEGATIVE NEGATIVE    Comment: (NOTE) SARS-CoV-2 target nucleic acids are NOT DETECTED.  The  SARS-CoV-2 RNA is generally detectable in upper and lower respiratory specimens during the acute phase of infection. The lowest concentration of SARS-CoV-2 viral copies this assay can detect is 250 copies / mL. A negative result does not preclude SARS-CoV-2 infection and should not be used as the sole basis for treatment or other patient management decisions.  A negative result may occur with improper specimen collection / handling, submission of specimen other than nasopharyngeal swab, presence of viral mutation(s) within the areas targeted by this assay, and inadequate number of viral copies (<250 copies / mL). A negative result must be combined with clinical observations, patient history, and epidemiological information.  Fact Sheet for Patients:   RoadLapTop.co.za  Fact Sheet for Healthcare Providers: http://kim-miller.com/  This test is not yet approved or  cleared by the [place] FDA and has been authorized for detection and/or diagnosis of SARS-CoV-2 by FDA under an Emergency Use Authorization (EUA).  This EUA will remain in effect (meaning this test can be used) for the duration of the COVID-19 declaration under Section 564(b)(1) of the Act, 21 U.S.C. section 360bbb-3(b)(1), unless the authorization is terminated or revoked sooner.  Performed at [facility], [address], [place], [place] [address]   CBC with Differential     Status: Abnormal   Collection Time: 01/26/23  8:43 PM  Result Value Ref Range   WBC 16.8 (H) 4.0 - 10.5 K/uL   RBC 5.27 (H) 3.87 - 5.11 MIL/uL   Hemoglobin 14.8 12.0 - 15.0 g/dL   HCT 62.9 52.8 - 41.3 %   MCV 86.5 80.0 - 100.0 fL   MCH 28.1 26.0 - 34.0 pg   MCHC 32.5 30.0 - 36.0 g/dL   RDW 24.4 01.0 - 27.2 %   Platelets 416 (H) 150 - 400 K/uL   nRBC 0.0 0.0 - 0.2 %   Neutrophils Relative % 84 %   Neutro Abs 14.1 (H) 1.7 - 7.7 K/uL   Lymphocytes Relative 8 %   Lymphs Abs  1.4 0.7 - 4.0 K/uL   Monocytes Relative 7 %   Monocytes Absolute 1.1 (H) 0.1 - 1.0 K/uL   Eosinophils Relative 0 %   Eosinophils Absolute 0.1 0.0 - 0.5 K/uL   Basophils Relative 0 %   Basophils Absolute 0.1 0.0 - 0.1 K/uL   Immature Granulocytes 1 %   Abs Immature Granulocytes 0.09 (H) 0.00 - 0.07 K/uL    Comment: Performed at [facility], [address] [place], [place] [address]  Comprehensive metabolic panel     Status: Abnormal   Collection Time: 01/26/23  8:43 PM  Result Value Ref Range   Sodium 136 135 - 145 mmol/L   Potassium 3.4 (L) 3.5 - 5.1 mmol/L   Chloride 98 98 - 111 mmol/L   CO2 24 22 - 32 mmol/L   Glucose, Bld 104 (H) 70 - 99 mg/dL    Comment: Glucose reference range applies only to samples taken after fasting  for at least 8 hours.   BUN 5 (L) 8 - 23 mg/dL   Creatinine, Ser 4.09 0.44 - 1.00 mg/dL   Calcium 8.9 8.9 - 81.1 mg/dL   Total Protein 8.0 6.5 - 8.1 g/dL   Albumin 3.6 3.5 - 5.0 g/dL   AST 48 (H) 15 - 41 U/L   ALT 37 0 - 44 U/L   Alkaline Phosphatase 109 38 - 126 U/L   Total Bilirubin 0.9 0.3 - 1.2 mg/dL   GFR, Estimated >91 >47 mL/min    Comment: (NOTE) Calculated using the CKD-EPI Creatinine Equation (2021)    Anion gap 14 5 - 15    Comment: Performed at [facility], [address] [place], [place] [address]  D-dimer, quantitative     Status: Abnormal   Collection Time: 01/26/23  8:43 PM  Result Value Ref Range   D-Dimer, Quant 0.87 (H) 0.00 - 0.50 ug/mL-FEU    Comment: (NOTE) At the manufacturer cut-off value of 0.5 g/mL FEU, this assay has a negative predictive value of 95-100%.This assay is intended for use in conjunction with a clinical pretest probability (PTP) assessment model to exclude pulmonary embolism (PE) and deep venous thrombosis (DVT) in outpatients suspected of PE or DVT. Results should be correlated with clinical presentation. Performed at [facility], [address] [place], [place] [address]   Brain natriuretic peptide     Status: None   Collection Time: 01/26/23  9:45 PM  Result Value Ref Range   B Natriuretic Peptide 82.9 0.0 - 100.0 pg/mL    Comment: Performed at [facility], [address], [place], [place] [address]  Respiratory (~20 pathogens) panel by PCR     Status: Abnormal   Collection Time: 01/27/23  2:39 AM   Specimen: Nasopharyngeal Swab; Respiratory  Result Value Ref Range   Adenovirus NOT DETECTED NOT DETECTED   Coronavirus 229E NOT DETECTED NOT DETECTED    Comment: (NOTE) The Coronavirus on the Respiratory Panel, DOES NOT test for the novel  Coronavirus (2019 nCoV)    Coronavirus HKU1 NOT DETECTED NOT DETECTED   Coronavirus NL63 NOT DETECTED NOT DETECTED   Coronavirus OC43 NOT DETECTED NOT DETECTED   Metapneumovirus NOT DETECTED NOT DETECTED   Rhinovirus / Enterovirus NOT DETECTED NOT DETECTED   Influenza A NOT DETECTED NOT DETECTED   Influenza B NOT DETECTED NOT DETECTED   Parainfluenza Virus 1 NOT DETECTED NOT DETECTED   Parainfluenza Virus 2 NOT DETECTED NOT DETECTED   Parainfluenza Virus 3 NOT DETECTED NOT DETECTED   Parainfluenza Virus 4 NOT DETECTED NOT DETECTED   Respiratory Syncytial Virus NOT DETECTED NOT DETECTED   Bordetella pertussis NOT DETECTED NOT DETECTED   Bordetella Parapertussis NOT DETECTED NOT DETECTED   Chlamydophila pneumoniae NOT DETECTED NOT DETECTED   Mycoplasma pneumoniae DETECTED (A) NOT DETECTED    Comment: Performed at [facility], [address] [place], [place] [address]  CBC     Status: Abnormal   Collection Time: 01/27/23  4:21 AM  Result Value Ref Range   WBC 17.2 (H) 4.0 - 10.5 K/uL   RBC 4.87 3.87 - 5.11 MIL/uL   Hemoglobin 13.5 12.0 - 15.0 g/dL   HCT 52.8 41.3 - 24.4 %   MCV 84.6 80.0 - 100.0 fL   MCH 27.7 26.0 - 34.0 pg   MCHC 32.8 30.0 - 36.0 g/dL   RDW 01.0 27.2 - 53.6 %   Platelets 436 (H) 150 - 400  K/uL   nRBC 0.0 0.0 - 0.2 %    Comment:  Performed at [facility], [address] [place], [place] [address]  Comprehensive metabolic panel     Status: Abnormal   Collection Time: 01/27/23  4:21 AM  Result Value Ref Range   Sodium 135 135 - 145 mmol/L   Potassium 2.8 (L) 3.5 - 5.1 mmol/L   Chloride 97 (L) 98 - 111 mmol/L   CO2 24 22 - 32 mmol/L   Glucose, Bld 167 (H) 70 - 99 mg/dL    Comment: Glucose reference range applies only to samples taken after fasting for at least 8 hours.   BUN 6 (L) 8 - 23 mg/dL   Creatinine, Ser 6.04 0.44 - 1.00 mg/dL   Calcium 8.5 (L) 8.9 - 10.3 mg/dL   Total Protein 7.2 6.5 - 8.1 g/dL   Albumin 3.1 (L) 3.5 - 5.0 g/dL   AST 33 15 - 41 U/L   ALT 30 0 - 44 U/L   Alkaline Phosphatase 98 38 - 126 U/L   Total Bilirubin 0.4 0.3 - 1.2 mg/dL   GFR, Estimated >54 >09 mL/min    Comment: (NOTE) Calculated using the CKD-EPI Creatinine Equation (2021)    Anion gap 14 5 - 15    Comment: Performed at [facility], [address], [place], [place] [address]  Procalcitonin     Status: None   Collection Time: 01/27/23  4:21 AM  Result Value Ref Range   Procalcitonin <0.10 ng/mL    Comment:        Interpretation: PCT (Procalcitonin) <= 0.5 ng/mL: Systemic infection (sepsis) is not likely. Local bacterial infection is possible. (NOTE)       Sepsis PCT Algorithm           Lower Respiratory Tract                                      Infection PCT Algorithm    ----------------------------     ----------------------------         PCT < 0.25 ng/mL                PCT < 0.10 ng/mL          Strongly encourage             Strongly discourage   discontinuation of antibiotics    initiation of antibiotics    ----------------------------     -----------------------------       PCT 0.25 - 0.50 ng/mL            PCT 0.10 - 0.25 ng/mL               OR       >80% decrease in PCT            Discourage initiation of                                            antibiotics       Encourage discontinuation           of antibiotics    ----------------------------     -----------------------------         PCT >= 0.50 ng/mL  PCT 0.26 - 0.50 ng/mL               AND        <80% decrease in PCT             Encourage initiation of                                             antibiotics       Encourage continuation           of antibiotics    ----------------------------     -----------------------------        PCT >= 0.50 ng/mL                  PCT > 0.50 ng/mL               AND         increase in PCT                  Strongly encourage                                      initiation of antibiotics    Strongly encourage escalation           of antibiotics                                     -----------------------------                                           PCT <= 0.25 ng/mL                                                 OR                                        > 80% decrease in PCT                                      Discontinue / Do not initiate                                             antibiotics  Performed at [facility], [address] [place], [place] [address]   Magnesium     Status: None   Collection Time: 01/27/23  4:21 AM  Result Value Ref Range   Magnesium 2.0 1.7 - 2.4 mg/dL    Comment: Performed at [facility], [address], [place], [place] [address]   CT Soft Tissue Neck W Contrast  Result Date: 01/26/2023 CLINICAL DATA:  Cough with possible soft tissue infection  of the neck. EXAM: CT NECK WITH CONTRAST TECHNIQUE: Multidetector CT imaging of the neck was performed using the standard protocol following the bolus administration of intravenous contrast. RADIATION DOSE REDUCTION: This exam was performed according to the departmental dose-optimization program which includes automated exposure control, adjustment of the mA and/or kV according to patient size and/or use of iterative  reconstruction technique. CONTRAST:  80mL OMNIPAQUE IOHEXOL 350 MG/ML SOLN COMPARISON:  None Available. FINDINGS: Pharynx and larynx: Normal. No mass or swelling. Salivary glands: No inflammation, mass, or stone. Thyroid: Surgically absent Lymph nodes: No enlarged or abnormal density lymph nodes. Vascular: Negative Limited intracranial: Normal Visualized orbits: Normal Mastoids and visualized paranasal sinuses: Clear. Skeleton: No acute or aggressive process. Upper chest: Negative. Other: None. IMPRESSION: 1. No acute abnormality of the neck. 2. Status post thyroidectomy. Electronically Signed   By: Deatra Robinson M.D.   On: 01/26/2023 23:33   CT Angio Chest PE W and/or Wo Contrast  Result Date: 01/26/2023 CLINICAL DATA:  Cough and shortness of breath.  Positive D-dimer. EXAM: CT ANGIOGRAPHY CHEST WITH CONTRAST TECHNIQUE: Multidetector CT imaging of the chest was performed using the standard protocol during bolus administration of intravenous contrast. Multiplanar CT image reconstructions and MIPs were obtained to evaluate the vascular anatomy. RADIATION DOSE REDUCTION: This exam was performed according to the departmental dose-optimization program which includes automated exposure control, adjustment of the mA and/or kV according to patient size and/or use of iterative reconstruction technique. CONTRAST:  80mL OMNIPAQUE IOHEXOL 350 MG/ML SOLN COMPARISON:  None Available. FINDINGS: Cardiovascular: There is adequate opacification of the pulmonary arteries to the segmental level. There is limited evaluation in the lower lungs, particularly on the right, secondary to respiratory motion artifact. There is no definite pulmonary embolism. Mediastinum/Nodes: Thyroid gland is surgically absent. There are no enlarged lymph nodes identified. There some prominent, but nonenlarged right hilar lymph nodes present. Esophagus is unremarkable. Lungs/Pleura: Tree-in-bud opacities are seen minimally in the inferior right upper  lobe scattered throughout the right lower lobe. There are additional patchy ground-glass and airspace opacities in the bilateral lower lobes, right greater than left, and minimally in the right middle lobe. No pleural effusion or pneumothorax. Upper Abdomen: No acute abnormality. Musculoskeletal: No chest wall abnormality. No acute or significant osseous findings. Review of the MIP images confirms the above findings. IMPRESSION: 1. Limited evaluation of the lower lungs secondary to respiratory motion artifact. No definite pulmonary embolism. 2. Tree-in-bud opacities in the right upper lobe and right lower lobe with additional patchy ground-glass and airspace opacities in the bilateral lower lobes, right greater than left, and minimally in the right middle lobe. Findings are most compatible with multifocal pneumonia. Recommend follow-up imaging to resolution. 3. Prominent, but nonenlarged right hilar lymph nodes are likely reactive. Electronically Signed   By: Darliss Cheney M.D.   On: 01/26/2023 23:27   DG Chest 2 View  Result Date: 01/26/2023 CLINICAL DATA:  Shortness of breath EXAM: CHEST - 2 VIEW COMPARISON:  None Available. FINDINGS: Faint right lung base densities may represent atelectasis or infiltrate. There is mild chronic bronchitic changes. No consolidative changes. There is no pleural effusion or pneumothorax. The cardiac silhouette is within normal limits. Atherosclerotic calcification of the aorta. No acute osseous pathology. IMPRESSION: Right lung base atelectasis or infiltrate. Electronically Signed   By: Elgie Collard M.D.   On: 01/26/2023 21:56    Pending Labs Unresulted Labs (From admission, onward)     Start     Ordered   01/28/23  0500  Potassium  Tomorrow morning,   R        01/27/23 1328   01/28/23 0500  CBC  Tomorrow morning,   R        01/27/23 1328            Vitals/Pain Today's Vitals   01/27/23 1222 01/27/23 1230 01/27/23 1300 01/27/23 1525  BP:  (!) 169/99 (!)  163/85 (!) 122/99  Pulse: 78 86 90 88  Resp: 20 20 18 20   Temp:      TempSrc:      SpO2: 97% 96% 95% 93%  Weight:      Height:      PainSc:        Isolation Precautions Droplet precaution  Medications Medications  azithromycin (ZITHROMAX) 500 mg in sodium chloride 0.9 % 250 mL IVPB (has no administration in time range)  cefTRIAXone (ROCEPHIN) 1 g in sodium chloride 0.9 % 100 mL IVPB (has no administration in time range)  albuterol (PROVENTIL) (2.5 MG/3ML) 0.083% nebulizer solution 2.5 mg (has no administration in time range)  levothyroxine (SYNTHROID) tablet 62.5 mcg (has no administration in time range)  Fezolinetant TABS 45 mg (has no administration in time range)  enoxaparin (LOVENOX) injection 40 mg (40 mg Subcutaneous Given 01/27/23 0917)  acetaminophen (TYLENOL) tablet 650 mg (has no administration in time range)    Or  acetaminophen (TYLENOL) suppository 650 mg (has no administration in time range)  levothyroxine (SYNTHROID) tablet 125 mcg (125 mcg Oral Given 01/27/23 0625)  loratadine (CLARITIN) tablet 10 mg (10 mg Oral Given 01/27/23 1526)  fluticasone (FLONASE) 50 MCG/ACT nasal spray 1 spray (has no administration in time range)  chlorpheniramine-HYDROcodone (TUSSIONEX) 10-8 MG/5ML suspension 5 mL (has no administration in time range)  benzonatate (TESSALON) capsule 100 mg (has no administration in time range)  ipratropium-albuterol (DUONEB) 0.5-2.5 (3) MG/3ML nebulizer solution 3 mL (3 mLs Nebulization Given 01/26/23 2142)  dexamethasone (DECADRON) injection 10 mg (10 mg Intravenous Given 01/26/23 2116)  iohexol (OMNIPAQUE) 350 MG/ML injection 80 mL (80 mLs Intravenous Contrast Given 01/26/23 2301)  cefTRIAXone (ROCEPHIN) 1 g in sodium chloride 0.9 % 100 mL IVPB (0 g Intravenous Stopped 01/27/23 0015)  azithromycin (ZITHROMAX) 500 mg in sodium chloride 0.9 % 250 mL IVPB (0 mg Intravenous Stopped 01/27/23 0124)  ondansetron (ZOFRAN) injection 4 mg (4 mg Intravenous Given 01/26/23  2343)  potassium chloride (KLOR-CON) packet 40 mEq (40 mEq Oral Given 01/27/23 0420)  potassium chloride SA (KLOR-CON M) CR tablet 40 mEq (40 mEq Oral Given 01/27/23 1526)    Mobility walks     Focused Assessments Pulmonary Assessment Handoff:  Lung sounds:   O2 Device: Room Air     R Recommendations: See Admitting Provider Note  Report given to:   Additional Notes:

## 2023-01-27 NOTE — H&P (Signed)
History and Physical    Kelly Ingram GNF:[id number] DOB: 05/12/1951 DOA: 01/26/2023  PCP: Adrian Prince, MD  Patient coming from: Home  Chief Complaint: Shortness of breath  HPI: Kelly Ingram is a 72 y.o. female with medical history significant of asthma, thyroid cancer status post total thyroidectomy, history of vocal cord implant, endometrial polyp, GERD, IBS, seasonal allergies presented to ED with complaints of shortness of breath and cough x 2 weeks.  She was seen by her PCP and was given a steroid shot, Tessalon Perles, promethazine, albuterol inhaler, and Z-Pak without relief.  In the ED, oxygen saturation 85% on room air with ambulation and placed on 2 L [place].  Afebrile.  Labs showing WBC 16.8, platelet count 416k, potassium 3.4, AST borderline elevated 48 and remainder of LFTs normal, SARS-CoV-2 PCR negative, D-dimer 0.87, BNP normal.   CTA chest showing: "IMPRESSION: 1. Limited evaluation of the lower lungs secondary to respiratory motion artifact. No definite pulmonary embolism. 2. Tree-in-bud opacities in the right upper lobe and right lower lobe with additional patchy ground-glass and airspace opacities in the bilateral lower lobes, right greater than left, and minimally in the right middle lobe. Findings are most compatible with multifocal pneumonia. Recommend follow-up imaging to resolution. 3. Prominent, but nonenlarged right hilar lymph nodes are likely reactive."   CT soft tissue neck with contrast showing: "IMPRESSION: 1. No acute abnormality of the neck. 2. Status post thyroidectomy."   Patient was given IV Decadron 10 mg, DuoNeb, Zofran, ceftriaxone, and azithromycin in ED.  [facility] called to admit.   Patient states she started having shortness of breath and cough 2 weeks ago and has been seen by her PCP multiple times since then.  States she was treated with a steroid shot, cough medications, and albuterol inhaler without relief.  Patient states she  was also prescribed Z-Pak but stopped taking it after a day or two.  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  She reports hoarse voice since her symptoms started 2 weeks ago.  Denies any trouble swallowing.  Denies chest pain.  She denies history of asthma or COPD and is not on any maintenance inhalers at home.  She has never smoked cigarettes.  No other complaints.  Review of Systems:  Review of Systems  All other systems reviewed and are negative.   Past Medical History:  Diagnosis Date   Asthma    Cancer (HCC) 2008   thyroid   Endometrial polyp 06/2013   benign   GERD (gastroesophageal reflux disease)    IBS (irritable bowel syndrome)    Seasonal allergies    Thyroid disease 2008   hx thyroid cancer--bilateral nodules    Past Surgical History:  Procedure Laterality Date   COLONOSCOPY     12 yrs ago-normal   DILATATION & CURRETTAGE/HYSTEROSCOPY WITH RESECTOCOPE N/A 07/03/2013   Procedure: DILATATION & CURETTAGE/HYSTEROSCOPY WITH RESECTOCOPE;  Surgeon: Jacqualin Combes de Gwenevere Ghazi, MD;  Location: [facility];  Service: Gynecology;  Laterality: N/A;   DILATION AND CURETTAGE OF UTERUS     SHOULDER SURGERY Left    TOTAL THYROIDECTOMY  2008   vocal cord implant Right 2009     reports that she has never smoked. She has never used smokeless tobacco. She reports current alcohol use. She reports that she does not use drugs.  Allergies  Allergen Reactions   Effexor [Venlafaxine] Other (See Comments)  Cognitive change, tremors. - July 30, 2013.   Erythromycin Nausea Only    GI upset.   Gluten Meal     Other reaction(s): Diarrhea, Headache   Adhesive [Tape]     Family History  Problem Relation Age of Onset   Thyroid disease Mother    Other Father        pacemaker   Hypertension Paternal Grandmother    Hypertension Paternal Grandfather    Colon  cancer Neg Hx     Prior to Admission medications   Medication Sig Start Date End Date Taking? Authorizing Provider  acetaminophen (TYLENOL) 500 MG tablet Take 500 mg by mouth every 6 (six) hours as needed for moderate pain.   Yes [provider]  benzonatate (TESSALON) 200 MG capsule Take 200 mg by mouth every 8 (eight) hours.   Yes [provider]  Cholecalciferol (VITAMIN D3) 10 MCG (400 UNIT) CAPS Take 1 capsule by mouth daily.   Yes [provider]  fexofenadine (ALLEGRA) 180 MG tablet Take 180 mg by mouth daily.   Yes [provider]  fluticasone (FLONASE) 50 MCG/ACT nasal spray Place 1 spray into both nostrils daily as needed for allergies or rhinitis.   Yes [provider]  hydrocortisone (ANUSOL-HC) 2.5 % rectal cream Place 1 application rectally as needed.  02/09/18  Yes [provider]  levothyroxine (SYNTHROID, LEVOTHROID) 125 MCG tablet Take 62.5-125 mcg by mouth as directed. Take 1 tablet daily for 6 Days and On Sundays Take 1/2 tablet (62.mcg)   Yes [provider]  Magnesium 100 MG CAPS Take 1 capsule by mouth daily.   Yes [provider]  Misc Natural Products (JOINT HEALTH PO) Take 2 tablets by mouth daily.   Yes [provider]  Multiple Vitamin (STRESSTABS PO) Take 1 tablet by mouth daily.   Yes [provider]  Multiple Vitamins-Minerals (ZINC PO) Take by mouth.   Yes [provider]  Probiotic Product (PROBIOTIC DAILY PO) Take 1 capsule by mouth daily.   Yes [provider]  promethazine-dextromethorphan (PROMETHAZINE-DM) 6.25-15 MG/5ML syrup Take 5 mLs by mouth at bedtime as needed for cough.   Yes [provider]  TURMERIC PO Take 1 tablet by mouth daily.    Yes [provider]  VENTOLIN HFA 108 (90 Base) MCG/ACT inhaler 2 puff q 4-6 hours prn cough/wheeze *may sub proair if better coverage* Inhalation every 4 hrs for 30 days 06/17/17  Yes [provider]  VEOZAH 45 MG TABS Take 1 tablet by mouth every evening. 06/24/22  Yes [provider]    Physical Exam: Vitals:   01/26/23 1955 01/26/23 1959 01/26/23 2330  BP: (!) 183/86  (!) 150/72  Pulse: 92 92 100  Resp: (!) 30  20  Temp: 98.4 F (36.9 C)  98.1 F (36.7 C)  SpO2: 90% 90% 92%  Weight: 70.8 kg    Height: 5\' 3"  (1.6 m)      Physical Exam Vitals reviewed.  Constitutional:      General: She is not in acute distress. HENT:     Head: Normocephalic and atraumatic.  Eyes:     Extraocular Movements: Extraocular movements intact.  Cardiovascular:     Rate and Rhythm: Normal rate and regular rhythm.     Pulses: Normal pulses.  Pulmonary:     Effort: Pulmonary effort is normal. No respiratory distress.     Breath sounds: No stridor. No wheezing.  Abdominal:     General: Bowel  sounds are normal. There is no distension.     Palpations: Abdomen is soft.     Tenderness: There is no abdominal tenderness. There is no guarding.  Musculoskeletal:     Cervical back: Normal range of motion.     Right lower leg: No edema.     Left lower leg: No edema.  Skin:    General: Skin is warm and dry.  Neurological:     General: No focal deficit present.     Mental Status: She is alert and oriented to person, place, and time.     Labs on Admission: I have personally reviewed following labs and imaging studies  CBC: Recent Labs  Lab 01/26/23 2043  WBC 16.8*  NEUTROABS 14.1*  HGB 14.8  HCT 45.6  MCV 86.5  PLT 416*   Basic Metabolic Panel: Recent Labs  Lab 01/26/23 2043  NA 136  K 3.4*  CL 98  CO2 24  GLUCOSE 104*  BUN 5*  CREATININE 0.51  CALCIUM 8.9   GFR: Estimated Creatinine Clearance: 58.2 mL/min (by C-G formula based on SCr of 0.51 mg/dL). Liver Function Tests: Recent Labs  Lab 01/26/23 2043  AST 48*  ALT 37  ALKPHOS 109  BILITOT 0.9  PROT 8.0  ALBUMIN 3.6   No results for input(s): "LIPASE", "AMYLASE" in the last 168 hours. No  results for input(s): "AMMONIA" in the last 168 hours. Coagulation Profile: No results for input(s): "INR", "PROTIME" in the last 168 hours. Cardiac Enzymes: No results for input(s): "CKTOTAL", "CKMB", "CKMBINDEX", "TROPONINI" in the last 168 hours. BNP (last 3 results) No results for input(s): "PROBNP" in the last 8760 hours. HbA1C: No results for input(s): "HGBA1C" in the last 72 hours. CBG: No results for input(s): "GLUCAP" in the last 168 hours. Lipid Profile: No results for input(s): "CHOL", "HDL", "LDLCALC", "TRIG", "CHOLHDL", "LDLDIRECT" in the last 72 hours. Thyroid Function Tests: No results for input(s): "TSH", "T4TOTAL", "FREET4", "T3FREE", "THYROIDAB" in the last 72 hours. Anemia Panel: No results for input(s): "VITAMINB12", "FOLATE", "FERRITIN", "TIBC", "IRON", "RETICCTPCT" in the last 72 hours. Urine analysis: No results found for: "COLORURINE", "APPEARANCEUR", "LABSPEC", "PHURINE", "GLUCOSEU", "HGBUR", "BILIRUBINUR", "KETONESUR", "PROTEINUR", "UROBILINOGEN", "NITRITE", "LEUKOCYTESUR"  Radiological Exams on Admission: CT Soft Tissue Neck W Contrast  Result Date: 01/26/2023 CLINICAL DATA:  Cough with possible soft tissue infection of the neck. EXAM: CT NECK WITH CONTRAST TECHNIQUE: Multidetector CT imaging of the neck was performed using the standard protocol following the bolus administration of intravenous contrast. RADIATION DOSE REDUCTION: This exam was performed according to the departmental dose-optimization program which includes automated exposure control, adjustment of the mA and/or kV according to patient size and/or use of iterative reconstruction technique. CONTRAST:  80mL OMNIPAQUE IOHEXOL 350 MG/ML SOLN COMPARISON:  None Available. FINDINGS: Pharynx and larynx: Normal. No mass or swelling. Salivary glands: No inflammation, mass, or stone. Thyroid: Surgically absent Lymph nodes: No enlarged or abnormal density lymph nodes. Vascular: Negative Limited intracranial:  Normal Visualized orbits: Normal Mastoids and visualized paranasal sinuses: Clear. Skeleton: No acute or aggressive process. Upper chest: Negative. Other: None. IMPRESSION: 1. No acute abnormality of the neck. 2. Status post thyroidectomy. Electronically Signed   By: Deatra Robinson M.D.   On: 01/26/2023 23:33   CT Angio Chest PE W and/or Wo Contrast  Result Date: 01/26/2023 CLINICAL DATA:  Cough and shortness of breath.  Positive D-dimer. EXAM: CT ANGIOGRAPHY CHEST WITH CONTRAST TECHNIQUE: Multidetector CT imaging of the chest was performed using the standard protocol during bolus administration  of intravenous contrast. Multiplanar CT image reconstructions and MIPs were obtained to evaluate the vascular anatomy. RADIATION DOSE REDUCTION: This exam was performed according to the departmental dose-optimization program which includes automated exposure control, adjustment of the mA and/or kV according to patient size and/or use of iterative reconstruction technique. CONTRAST:  80mL OMNIPAQUE IOHEXOL 350 MG/ML SOLN COMPARISON:  None Available. FINDINGS: Cardiovascular: There is adequate opacification of the pulmonary arteries to the segmental level. There is limited evaluation in the lower lungs, particularly on the right, secondary to respiratory motion artifact. There is no definite pulmonary embolism. Mediastinum/Nodes: Thyroid gland is surgically absent. There are no enlarged lymph nodes identified. There some prominent, but nonenlarged right hilar lymph nodes present. Esophagus is unremarkable. Lungs/Pleura: Tree-in-bud opacities are seen minimally in the inferior right upper lobe scattered throughout the right lower lobe. There are additional patchy ground-glass and airspace opacities in the bilateral lower lobes, right greater than left, and minimally in the right middle lobe. No pleural effusion or pneumothorax. Upper Abdomen: No acute abnormality. Musculoskeletal: No chest wall abnormality. No acute or  significant osseous findings. Review of the MIP images confirms the above findings. IMPRESSION: 1. Limited evaluation of the lower lungs secondary to respiratory motion artifact. No definite pulmonary embolism. 2. Tree-in-bud opacities in the right upper lobe and right lower lobe with additional patchy ground-glass and airspace opacities in the bilateral lower lobes, right greater than left, and minimally in the right middle lobe. Findings are most compatible with multifocal pneumonia. Recommend follow-up imaging to resolution. 3. Prominent, but nonenlarged right hilar lymph nodes are likely reactive. Electronically Signed   By: Darliss Cheney M.D.   On: 01/26/2023 23:27   DG Chest 2 View  Result Date: 01/26/2023 CLINICAL DATA:  Shortness of breath EXAM: CHEST - 2 VIEW COMPARISON:  None Available. FINDINGS: Faint right lung base densities may represent atelectasis or infiltrate. There is mild chronic bronchitic changes. No consolidative changes. There is no pleural effusion or pneumothorax. The cardiac silhouette is within normal limits. Atherosclerotic calcification of the aorta. No acute osseous pathology. IMPRESSION: Right lung base atelectasis or infiltrate. Electronically Signed   By: Elgie Collard M.D.   On: 01/26/2023 21:56    EKG: Independently reviewed. Sinus rhythm, RBBB, LAFB, baseline wander in inferior and lateral leads.  Assessment and Plan  Acute hypoxemic respiratory failure secondary to multifocal pneumonia Oxygen saturation 85% on room air with ambulation, currently stable on 2 L [place].  CT with findings concerning for multifocal pneumonia.  WBC count 16.8, afebrile.  No signs of sepsis at this time.  SARS-CoV-2 PCR negative.  Continue ceftriaxone and azithromycin.  Antitussives as needed.  Monitor WBC count and check procalcitonin level.  Respiratory viral panel ordered.  Continue supplemental oxygen, wean as tolerated.  Asthma? Listed in medical record but patient denies history of  asthma or COPD and is not on maintenance inhalers at home.  Denies history of cigarette smoking.  No wheezing on exam.  Albuterol neb as needed.  Hypothyroidism Continue Synthroid.  History of vocal cord implant No wheezing or stridor on exam.  CT soft tissue neck negative for acute abnormality.  Mild hypokalemia Monitor potassium and magnesium levels, continue to replace as needed.  DVT prophylaxis: Lovenox Code Status: DNR/DNI (discussed with the patient) Level of care: Progressive Care Unit Admission status: It is my clinical opinion that admission to INPATIENT is reasonable and necessary because of the expectation that this patient will require hospital care that crosses at least 2 midnights  to treat this condition based on the medical complexity of the problems presented.  Given the aforementioned information, the predictability of an adverse outcome is felt to be significant.  John Giovanni MD [facility]  If 7PM-7AM, please contact night-coverage [email or web address]  01/27/2023, 2:05 AM

## 2023-01-28 DIAGNOSIS — J189 Pneumonia, unspecified organism: Secondary | ICD-10-CM | POA: Diagnosis not present

## 2023-01-28 LAB — CBC
HCT: 42.2 % (ref 36.0–46.0)
Hemoglobin: 13.6 g/dL (ref 12.0–15.0)
MCH: 27.6 pg (ref 26.0–34.0)
MCHC: 32.2 g/dL (ref 30.0–36.0)
MCV: 85.8 fL (ref 80.0–100.0)
Platelets: 426 10*3/uL — ABNORMAL HIGH (ref 150–400)
RBC: 4.92 MIL/uL (ref 3.87–5.11)
RDW: 13.8 % (ref 11.5–15.5)
WBC: 20.4 10*3/uL — ABNORMAL HIGH (ref 4.0–10.5)
nRBC: 0 % (ref 0.0–0.2)

## 2023-01-28 LAB — POTASSIUM: Potassium: 4.1 mmol/L (ref 3.5–5.1)

## 2023-01-28 MED ORDER — BENZONATATE 100 MG PO CAPS
200.0000 mg | ORAL_CAPSULE | Freq: Three times a day (TID) | ORAL | Status: DC | PRN
  Administered 2023-01-28 – 2023-01-29 (×2): 200 mg via ORAL
  Administered 2023-01-29: 100 mg via ORAL
  Filled 2023-01-28 (×3): qty 2

## 2023-01-28 NOTE — Progress Notes (Signed)
OT Cancellation Note  Patient Details Name: Kelly Ingram MRN: [id number] DOB: [birth year]   Cancelled Treatment:    Reason Eval/Treat Not Completed: OT screened, no needs identified, will sign off.   Reuben Likes, OTR/L 01/28/2023, 11:12 AM

## 2023-01-28 NOTE — TOC CM/SW Note (Signed)
Transition of Care [facility]) - Inpatient Brief Assessment   Patient Details  Name: Kelly Ingram MRN: [id number] Date of Birth: 20-May-1950  Transition of Care [facility]) CM/SW Contact:    Larrie Kass, LCSW Phone Number: 01/28/2023, 11:24 AM   Clinical Narrative:  Transition of Care Department [facility]) has reviewed patient and no TOC needs have been identified at this time. We will continue to monitor patient advancement through interdisciplinary progression rounds. If new patient transition needs arise, please place a TOC consult.  Transition of Care Asessment: Insurance and Status: Insurance coverage has been reviewed Patient has primary care physician: Yes Home environment has been reviewed: home with spouce Prior level of function:: independent Prior/Current Home Services: No current home services Social Determinants of Health Reivew: SDOH reviewed no interventions necessary Readmission risk has been reviewed: Yes Transition of care needs: no transition of care needs at this time

## 2023-01-28 NOTE — Evaluation (Signed)
Physical Therapy Evaluation Patient Details Name: Kelly Ingram MRN: [id number] DOB: 11-30-1950 Today's Date: 01/28/2023  History of Present Illness  72 y.o. female with a history of asthma, thyroid cancer status post total thyroidectomy with resultant hypothyroidism, vocal cord dysfunction status post vocal cord implant, GERD, IBS, seasonal allergies.  Patient presented secondary to shortness of breath and was found to have evidence of multifocal pneumonia  Clinical Impression  Pt is mobilizing well at an independent level, she ambulated 350' without an assistive device, no loss of balance, SpO2 90-95% on room air walking, SpO2 97% on room air at rest. No further PT indicated, will sign off.          If plan is discharge home, recommend the following:     Can travel by private vehicle        Equipment Recommendations None recommended by PT  Recommendations for Other Services       Functional Status Assessment Patient has not had a recent decline in their functional status     Precautions / Restrictions Precautions Precautions: None Restrictions Weight Bearing Restrictions: No      Mobility  Bed Mobility Overal bed mobility: Independent                  Transfers Overall transfer level: Independent                      Ambulation/Gait Ambulation/Gait assistance: Independent Gait Distance (Feet): 350 Feet Assistive device: None Gait Pattern/deviations: WFL(Within Functional Limits) Gait velocity: WNL     General Gait Details: steady, no loss of balance, SpO2 90-95% on room air walking  Stairs            Wheelchair Mobility     Tilt Bed    Modified Rankin (Stroke Patients Only)       Balance Overall balance assessment: Independent                                           Pertinent Vitals/Pain Pain Assessment Pain Assessment: No/denies pain    Home Living Family/patient expects to be discharged  to:: Private residence Living Arrangements: Spouse/significant other Available Help at Discharge: Family   Home Access: Stairs to enter   Secretary/administrator of Steps: 4   Home Layout: Two level;Able to live on main level with bedroom/bathroom Home Equipment: None      Prior Function Prior Level of Function : Independent/Modified Independent;Driving             Mobility Comments: independent without AD ADLs Comments: independent     Extremity/Trunk Assessment   Upper Extremity Assessment Upper Extremity Assessment: Overall WFL for tasks assessed    Lower Extremity Assessment Lower Extremity Assessment: Overall WFL for tasks assessed    Cervical / Trunk Assessment Cervical / Trunk Assessment: Normal  Communication   Communication Communication: No apparent difficulties  Cognition Arousal: Alert Behavior During Therapy: WFL for tasks assessed/performed Overall Cognitive Status: Within Functional Limits for tasks assessed                                          General Comments      Exercises     Assessment/Plan    PT Assessment Patient does not need any further  PT services  PT Problem List         PT Treatment Interventions      PT Goals (Current goals can be found in the Care Plan section)  Acute Rehab PT Goals PT Goal Formulation: All assessment and education complete, DC therapy    Frequency       Co-evaluation               AM-PAC PT "6 Clicks" Mobility  Outcome Measure Help needed turning from your back to your side while in a flat bed without using bedrails?: None Help needed moving from lying on your back to sitting on the side of a flat bed without using bedrails?: None Help needed moving to and from a bed to a chair (including a wheelchair)?: None Help needed standing up from a chair using your arms (e.g., wheelchair or bedside chair)?: None Help needed to walk in hospital room?: None Help needed climbing  3-5 steps with a railing? : None 6 Click Score: 24    End of Session   Activity Tolerance: Patient tolerated treatment well Patient left: in bed;with call bell/phone within reach Nurse Communication: Mobility status      Time: 8657-8469 PT Time Calculation (min) (ACUTE ONLY): 20 min   Charges:     PT Treatments $Gait Training: 8-22 mins PT General Charges $$ ACUTE PT VISIT: 1 Visit         Tamala Ser PT 01/28/2023  Acute Rehabilitation Services  Office [phone]

## 2023-01-28 NOTE — Plan of Care (Signed)

## 2023-01-28 NOTE — Progress Notes (Signed)
PROGRESS NOTE    Kelly Ingram  GNF:[id number] DOB: [birth year] DOA: 01/26/2023 PCP: Adrian Prince, MD   Brief Narrative: Kelly Ingram is a 72 y.o. female with a history of asthma, thyroid cancer status post total thyroidectomy with resultant hypothyroidism, vocal cord dysfunction status post vocal cord implant, GERD, IBS, seasonal allergies.  Patient presented secondary to shortness of breath and was found to have evidence of multifocal pneumonia concerning for atypical infection.  Patient was started on empiric treatment for community-acquired pneumonia with ceftriaxone and azithromycin.  Patient has recently been treated for pneumonia with azithromycin but did not complete her course.  COVID-19 testing was negative.  Respiratory virus panel was positive for mycoplasma pneumoniae.   Assessment/Plan:  Community-acquired pneumonia Patient with symptoms of cough with prior production in addition to initial hypoxia and bilateral infiltrates consistent with multifocal pneumonia.  Respiratory virus panel testing was positive for mycoplasma pneumoniae infection.  Patient was started empirically on ceftriaxone and azithromycin on admission.  Associated leukocytosis, however patient did get Decadron this admission.  Procalcitonin is undetectable.  Afebrile.  Cough is no longer productive. -Continue ceftriaxone and azithromycin -Droplet precautions  Acute respiratory failure with hypoxia Per documentation, patient with oxygen saturation as low as 85% on room air at rest.  Patient with associated dyspnea, especially with exertion.  Patient started on 2 L/min of oxygen per nasal cannula with improvement of symptoms.  Hypoxia secondary to pneumonia. Patient weaned to room air with persistent functional deficits. -Incentive spirometer  Hypothyroidism -Continue Synthroid 62.5 mcg daily  Seasonal allergies -Claritin (substituted for home Allegra) -Flonase  History of vocal cord  implant Patient reports feeling like her airway closes at times.  Patient is without stridor or other worrisome airway sounds.  Patient shows good oxygenation at this time.  No respiratory distress concerning for compromised airway.  CT soft tissue of the neck was obtained this admission and was negative for acute abnormality.  Patient received Decadron while in the emergency department.  Leukocytosis Likely related to steroids. No evidence of worsening infection. Afebrile.  Hypokalemia Mild at 3.4 on admission with worsening to 2.8. Resolved with potassium supplementation   DVT prophylaxis: Lovenox Code Status:   Code Status: Limited: Do not attempt resuscitation (DNR) -DNR-LIMITED -Do Not Intubate/DNI  Family Communication: Husband at bedside Disposition Plan: Discharge likely in 24 hours pending improvement in functional capabilities   Consultants:  None  Procedures:  None  Antimicrobials: Ceftriaxone IV Azithromycin IV   Subjective: Cough is somewhat improved with Tessalon perles. Still having functional limitations as it relates to mobility.   Objective: BP (!) 150/82   Pulse 81   Temp 97.7 F (36.5 C) (Oral)   Resp 20   Ht 5\' 3"  (1.6 m)   Wt 70.8 kg   LMP 05/17/1998 Comment: pt had bleeding from polyp 2/15  SpO2 94%   BMI 27.65 kg/m   Examination:  General exam: Appears calm and comfortable Respiratory system: Decreased breath sounds at bilateral bases. Respiratory effort normal. Cardiovascular system: S1 & S2 heard, RRR. No murmurs, rubs, gallops or clicks. Gastrointestinal system: Abdomen is nondistended, soft and nontender. Normal bowel sounds heard. Central nervous system: Alert and oriented. No focal neurological deficits. Musculoskeletal: No edema. No calf tenderness Skin: No cyanosis. No rashes Psychiatry: Judgement and insight appear normal. Mood & affect appropriate.    Data Reviewed: I have personally reviewed following labs and imaging  studies   Last CBC Lab Results  Component Value Date   WBC  20.4 (H) 01/28/2023   HGB 13.6 01/28/2023   HCT 42.2 01/28/2023   MCV 85.8 01/28/2023   MCH 27.6 01/28/2023   RDW 13.8 01/28/2023   PLT 426 (H) 01/28/2023     Last metabolic panel Lab Results  Component Value Date   GLUCOSE 167 (H) 01/27/2023   NA 135 01/27/2023   K 4.1 01/28/2023   CL 97 (L) 01/27/2023   CO2 24 01/27/2023   BUN 6 (L) 01/27/2023   CREATININE 0.61 01/27/2023   GFRNONAA >60 01/27/2023   CALCIUM 8.5 (L) 01/27/2023   PROT 7.2 01/27/2023   ALBUMIN 3.1 (L) 01/27/2023   BILITOT 0.4 01/27/2023   ALKPHOS 98 01/27/2023   AST 33 01/27/2023   ALT 30 01/27/2023   ANIONGAP 14 01/27/2023     Creatinine Clearance: Estimated Creatinine Clearance: 58.2 mL/min (by C-G formula based on SCr of 0.61 mg/dL).  Recent Results (from the past 240 hour(s))  SARS Coronavirus 2 by RT PCR (hospital order, performed in [facility] hospital lab) *cepheid single result test* Anterior Nasal Swab     Status: None   Collection Time: 01/26/23  8:32 PM   Specimen: Anterior Nasal Swab  Result Value Ref Range Status   SARS Coronavirus 2 by RT PCR NEGATIVE NEGATIVE Final    Comment: (NOTE) SARS-CoV-2 target nucleic acids are NOT DETECTED.  The SARS-CoV-2 RNA is generally detectable in upper and lower respiratory specimens during the acute phase of infection. The lowest concentration of SARS-CoV-2 viral copies this assay can detect is 250 copies / mL. A negative result does not preclude SARS-CoV-2 infection and should not be used as the sole basis for treatment or other patient management decisions.  A negative result may occur with improper specimen collection / handling, submission of specimen other than nasopharyngeal swab, presence of viral mutation(s) within the areas targeted by this assay, and inadequate number of viral copies (<250 copies / mL). A negative result must be combined with clinical observations, patient  history, and epidemiological information.  Fact Sheet for Patients:   RoadLapTop.co.za  Fact Sheet for Healthcare Providers: http://kim-miller.com/  This test is not yet approved or  cleared by the [place] FDA and has been authorized for detection and/or diagnosis of SARS-CoV-2 by FDA under an Emergency Use Authorization (EUA).  This EUA will remain in effect (meaning this test can be used) for the duration of the COVID-19 declaration under Section 564(b)(1) of the Act, 21 U.S.C. section 360bbb-3(b)(1), unless the authorization is terminated or revoked sooner.  Performed at [facility], [address], [place], [place] [address]   Respiratory (~20 pathogens) panel by PCR     Status: Abnormal   Collection Time: 01/27/23  2:39 AM   Specimen: Nasopharyngeal Swab; Respiratory  Result Value Ref Range Status   Adenovirus NOT DETECTED NOT DETECTED Final   Coronavirus 229E NOT DETECTED NOT DETECTED Final    Comment: (NOTE) The Coronavirus on the Respiratory Panel, DOES NOT test for the novel  Coronavirus (2019 nCoV)    Coronavirus HKU1 NOT DETECTED NOT DETECTED Final   Coronavirus NL63 NOT DETECTED NOT DETECTED Final   Coronavirus OC43 NOT DETECTED NOT DETECTED Final   Metapneumovirus NOT DETECTED NOT DETECTED Final   Rhinovirus / Enterovirus NOT DETECTED NOT DETECTED Final   Influenza A NOT DETECTED NOT DETECTED Final   Influenza B NOT DETECTED NOT DETECTED Final   Parainfluenza Virus 1 NOT DETECTED NOT DETECTED Final   Parainfluenza Virus 2 NOT DETECTED NOT DETECTED  Final   Parainfluenza Virus 3 NOT DETECTED NOT DETECTED Final   Parainfluenza Virus 4 NOT DETECTED NOT DETECTED Final   Respiratory Syncytial Virus NOT DETECTED NOT DETECTED Final   Bordetella pertussis NOT DETECTED NOT DETECTED Final   Bordetella Parapertussis NOT DETECTED NOT DETECTED Final   Chlamydophila pneumoniae NOT DETECTED NOT DETECTED  Final   Mycoplasma pneumoniae DETECTED (A) NOT DETECTED Final    Comment: Performed at [facility], [address] [place], [place] [address]      Radiology Studies: CT Soft Tissue Neck W Contrast  Result Date: 01/26/2023 CLINICAL DATA:  Cough with possible soft tissue infection of the neck. EXAM: CT NECK WITH CONTRAST TECHNIQUE: Multidetector CT imaging of the neck was performed using the standard protocol following the bolus administration of intravenous contrast. RADIATION DOSE REDUCTION: This exam was performed according to the departmental dose-optimization program which includes automated exposure control, adjustment of the mA and/or kV according to patient size and/or use of iterative reconstruction technique. CONTRAST:  80mL OMNIPAQUE IOHEXOL 350 MG/ML SOLN COMPARISON:  None Available. FINDINGS: Pharynx and larynx: Normal. No mass or swelling. Salivary glands: No inflammation, mass, or stone. Thyroid: Surgically absent Lymph nodes: No enlarged or abnormal density lymph nodes. Vascular: Negative Limited intracranial: Normal Visualized orbits: Normal Mastoids and visualized paranasal sinuses: Clear. Skeleton: No acute or aggressive process. Upper chest: Negative. Other: None. IMPRESSION: 1. No acute abnormality of the neck. 2. Status post thyroidectomy. Electronically Signed   By: Deatra Robinson M.D.   On: 01/26/2023 23:33   CT Angio Chest PE W and/or Wo Contrast  Result Date: 01/26/2023 CLINICAL DATA:  Cough and shortness of breath.  Positive D-dimer. EXAM: CT ANGIOGRAPHY CHEST WITH CONTRAST TECHNIQUE: Multidetector CT imaging of the chest was performed using the standard protocol during bolus administration of intravenous contrast. Multiplanar CT image reconstructions and MIPs were obtained to evaluate the vascular anatomy. RADIATION DOSE REDUCTION: This exam was performed according to the departmental dose-optimization program which includes automated exposure control, adjustment of the  mA and/or kV according to patient size and/or use of iterative reconstruction technique. CONTRAST:  80mL OMNIPAQUE IOHEXOL 350 MG/ML SOLN COMPARISON:  None Available. FINDINGS: Cardiovascular: There is adequate opacification of the pulmonary arteries to the segmental level. There is limited evaluation in the lower lungs, particularly on the right, secondary to respiratory motion artifact. There is no definite pulmonary embolism. Mediastinum/Nodes: Thyroid gland is surgically absent. There are no enlarged lymph nodes identified. There some prominent, but nonenlarged right hilar lymph nodes present. Esophagus is unremarkable. Lungs/Pleura: Tree-in-bud opacities are seen minimally in the inferior right upper lobe scattered throughout the right lower lobe. There are additional patchy ground-glass and airspace opacities in the bilateral lower lobes, right greater than left, and minimally in the right middle lobe. No pleural effusion or pneumothorax. Upper Abdomen: No acute abnormality. Musculoskeletal: No chest wall abnormality. No acute or significant osseous findings. Review of the MIP images confirms the above findings. IMPRESSION: 1. Limited evaluation of the lower lungs secondary to respiratory motion artifact. No definite pulmonary embolism. 2. Tree-in-bud opacities in the right upper lobe and right lower lobe with additional patchy ground-glass and airspace opacities in the bilateral lower lobes, right greater than left, and minimally in the right middle lobe. Findings are most compatible with multifocal pneumonia. Recommend follow-up imaging to resolution. 3. Prominent, but nonenlarged right hilar lymph nodes are likely reactive. Electronically Signed   By: Darliss Cheney M.D.   On: 01/26/2023 23:27  DG Chest 2 View  Result Date: 01/26/2023 CLINICAL DATA:  Shortness of breath EXAM: CHEST - 2 VIEW COMPARISON:  None Available. FINDINGS: Faint right lung base densities may represent atelectasis or infiltrate.  There is mild chronic bronchitic changes. No consolidative changes. There is no pleural effusion or pneumothorax. The cardiac silhouette is within normal limits. Atherosclerotic calcification of the aorta. No acute osseous pathology. IMPRESSION: Right lung base atelectasis or infiltrate. Electronically Signed   By: Elgie Collard M.D.   On: 01/26/2023 21:56      LOS: 1 day    Jacquelin Hawking, MD [facility] 01/28/2023, 2:44 PM   If 7PM-7AM, please contact night-coverage [email or web address]

## 2023-01-29 DIAGNOSIS — J189 Pneumonia, unspecified organism: Secondary | ICD-10-CM | POA: Diagnosis not present

## 2023-01-29 DIAGNOSIS — J157 Pneumonia due to Mycoplasma pneumoniae: Secondary | ICD-10-CM | POA: Insufficient documentation

## 2023-01-29 LAB — CBC
HCT: 41.9 % (ref 36.0–46.0)
Hemoglobin: 13.5 g/dL (ref 12.0–15.0)
MCH: 27.4 pg (ref 26.0–34.0)
MCHC: 32.2 g/dL (ref 30.0–36.0)
MCV: 85.2 fL (ref 80.0–100.0)
Platelets: 528 10*3/uL — ABNORMAL HIGH (ref 150–400)
RBC: 4.92 MIL/uL (ref 3.87–5.11)
RDW: 13.6 % (ref 11.5–15.5)
WBC: 15.9 10*3/uL — ABNORMAL HIGH (ref 4.0–10.5)
nRBC: 0 % (ref 0.0–0.2)

## 2023-01-29 MED ORDER — CEFDINIR 300 MG PO CAPS: 300.0000 mg | ORAL_CAPSULE | Freq: Two times a day (BID) | ORAL | 0 refills | Status: AC

## 2023-01-29 MED ORDER — BENZONATATE 200 MG PO CAPS: 200.0000 mg | ORAL_CAPSULE | Freq: Three times a day (TID) | ORAL | 0 refills | Status: AC | PRN

## 2023-01-29 NOTE — Discharge Summary (Signed)
Physician Discharge Summary   Patient: Kelly Ingram MRN: [id number] DOB: [birth year]  Admit date:     01/26/2023  Discharge date: 01/29/23  Discharge Physician: Jacquelin Hawking, MD   PCP: Adrian Prince, MD   Recommendations at discharge:  PCP visit for hospital follow-up Consider repeat chest x-ray in 3-4 weeks if needed Consider pulmonology referral if persistent cough  Discharge Diagnoses: Principal Problem:   Multifocal pneumonia Active Problems:   Hypothyroidism   Acute hypoxemic respiratory failure (HCC)   Hypokalemia   Mycoplasma pneumoniae pneumonia  Resolved Problems:   * No resolved hospital problems. *  Hospital Course: Kelly Ingram is a 72 y.o. female with a history of asthma, thyroid cancer status post total thyroidectomy with resultant hypothyroidism, vocal cord dysfunction status post vocal cord implant, GERD, IBS, seasonal allergies.  Patient presented secondary to shortness of breath and was found to have evidence of multifocal pneumonia concerning for atypical infection.  Patient was started on empiric treatment for community-acquired pneumonia with ceftriaxone and azithromycin.  Patient has recently been treated for pneumonia with azithromycin but did not complete her course.  COVID-19 testing was negative.  Respiratory virus panel was positive for mycoplasma pneumoniae. Patient improved with antibiotic treatment and supportive care for cough. Patient completed azithromycin prior to discharge and will discharge on Cefdinir to complete 5 days of total antibiotic treatment.  Assessment and Plan:  Community-acquired pneumonia Patient with symptoms of cough with prior production in addition to initial hypoxia and bilateral infiltrates consistent with multifocal pneumonia.  Respiratory virus panel testing was positive for mycoplasma pneumoniae infection.  Patient was started empirically on ceftriaxone and azithromycin on admission.  Associated  leukocytosis, however patient did get Decadron this admission.  Procalcitonin is undetectable.  Afebrile.  Cough is no longer productive. Patient completed Azithromycin course prior to discharge and discharged on Cefdinir to complete total antibiotic course.   Acute respiratory failure with hypoxia Per documentation, patient with oxygen saturation as low as 85% on room air at rest.  Patient with associated dyspnea, especially with exertion.  Patient started on 2 L/min of oxygen per nasal cannula with improvement of symptoms.  Hypoxia secondary to pneumonia. Patient weaned to room air.  Cough Persistent. Improvement with treatment. Likely contributed to by dry throat. Recommended patient to focus breathing through nose, chew guy, stay hydrated to improve hydration in oropharynx. Patient reports no history of GERD.   Hypothyroidism Continue Synthroid 62.5-125 mcg daily   Seasonal allergies Continue home Allegra and Flonase.   History of vocal cord implant Patient reports feeling like her airway closes at times.  Patient is without stridor or other worrisome airway sounds.  Patient shows good oxygenation at this time.  No respiratory distress concerning for compromised airway.  CT soft tissue of the neck was obtained this admission and was negative for acute abnormality.  Patient received Decadron while in the emergency department.   Leukocytosis Likely related to steroids. No evidence of worsening infection. Afebrile. Improved prior to discharge.   Hypokalemia Mild at 3.4 on admission with worsening to 2.8. Resolved with potassium supplementation  Elevated blood pressure Recheck as an outpatient.   Consultants: None Procedures performed: None  Disposition: Home Diet recommendation: Regular diet   DISCHARGE MEDICATION: Allergies as of 01/29/2023       Reactions   Effexor [venlafaxine] Other (See Comments)   Cognitive change, tremors. - July 30, 2013.   Erythromycin Nausea Only    GI upset.   Gluten Meal    Other  reaction(s): Diarrhea, Headache   Adhesive [tape]         Medication List     STOP taking these medications    promethazine-dextromethorphan 6.25-15 MG/5ML syrup Commonly known as: PROMETHAZINE-DM       TAKE these medications    acetaminophen 500 MG tablet Commonly known as: TYLENOL Take 500 mg by mouth every 6 (six) hours as needed for moderate pain.   benzonatate 200 MG capsule Commonly known as: TESSALON Take 1 capsule (200 mg total) by mouth 3 (three) times daily as needed for cough. What changed:  when to take this reasons to take this   cefdinir 300 MG capsule Commonly known as: OMNICEF Take 1 capsule (300 mg total) by mouth 2 (two) times daily for 2 days.   fexofenadine 180 MG tablet Commonly known as: ALLEGRA Take 180 mg by mouth daily.   fluticasone 50 MCG/ACT nasal spray Commonly known as: FLONASE Place 1 spray into both nostrils daily as needed for allergies or rhinitis.   hydrocortisone 2.5 % rectal cream Commonly known as: ANUSOL-HC Place 1 application rectally as needed.   JOINT HEALTH PO Take 2 tablets by mouth daily.   levothyroxine 125 MCG tablet Commonly known as: SYNTHROID Take 62.5-125 mcg by mouth as directed. Take 1 tablet daily for 6 Days and On Sundays Take 1/2 tablet (62.mcg)   Magnesium 100 MG Caps Take 1 capsule by mouth daily.   PROBIOTIC DAILY PO Take 1 capsule by mouth daily.   STRESSTABS PO Take 1 tablet by mouth daily.   TURMERIC PO Take 1 tablet by mouth daily.   Ventolin HFA 108 (90 Base) MCG/ACT inhaler Generic drug: albuterol 2 puff q 4-6 hours prn cough/wheeze *may sub proair if better coverage* Inhalation every 4 hrs for 30 days   Veozah 45 MG Tabs Generic drug: Fezolinetant Take 1 tablet by mouth every evening.   Vitamin D3 10 MCG (400 UNIT) Caps Take 1 capsule by mouth daily.   ZINC PO Take by mouth.        Discharge Exam: BP (!) 153/70 (BP Location: Left Arm)    Pulse 82   Temp 98.9 F (37.2 C) (Oral)   Resp 18   Ht 5\' 3"  (1.6 m)   Wt 70.8 kg   LMP 05/17/1998 Comment: pt had bleeding from polyp 2/15  SpO2 95%   BMI 27.65 kg/m   General exam: Appears calm and comfortable Respiratory system: Respiratory effort normal. Gastrointestinal system: Abdomen is non-distended Central nervous system: Alert and oriented. Psychiatry: Judgement and insight appear normal. Mood & affect appropriate.   Condition at discharge: stable  The results of significant diagnostics from this hospitalization (including imaging, microbiology, ancillary and laboratory) are listed below for reference.   Imaging Studies: CT Soft Tissue Neck W Contrast  Result Date: 01/26/2023 CLINICAL DATA:  Cough with possible soft tissue infection of the neck. EXAM: CT NECK WITH CONTRAST TECHNIQUE: Multidetector CT imaging of the neck was performed using the standard protocol following the bolus administration of intravenous contrast. RADIATION DOSE REDUCTION: This exam was performed according to the departmental dose-optimization program which includes automated exposure control, adjustment of the mA and/or kV according to patient size and/or use of iterative reconstruction technique. CONTRAST:  80mL OMNIPAQUE IOHEXOL 350 MG/ML SOLN COMPARISON:  None Available. FINDINGS: Pharynx and larynx: Normal. No mass or swelling. Salivary glands: No inflammation, mass, or stone. Thyroid: Surgically absent Lymph nodes: No enlarged or abnormal density lymph nodes. Vascular: Negative Limited intracranial: Normal Visualized orbits:  Normal Mastoids and visualized paranasal sinuses: Clear. Skeleton: No acute or aggressive process. Upper chest: Negative. Other: None. IMPRESSION: 1. No acute abnormality of the neck. 2. Status post thyroidectomy. Electronically Signed   By: Deatra Robinson M.D.   On: 01/26/2023 23:33   CT Angio Chest PE W and/or Wo Contrast  Result Date: 01/26/2023 CLINICAL DATA:  Cough and  shortness of breath.  Positive D-dimer. EXAM: CT ANGIOGRAPHY CHEST WITH CONTRAST TECHNIQUE: Multidetector CT imaging of the chest was performed using the standard protocol during bolus administration of intravenous contrast. Multiplanar CT image reconstructions and MIPs were obtained to evaluate the vascular anatomy. RADIATION DOSE REDUCTION: This exam was performed according to the departmental dose-optimization program which includes automated exposure control, adjustment of the mA and/or kV according to patient size and/or use of iterative reconstruction technique. CONTRAST:  80mL OMNIPAQUE IOHEXOL 350 MG/ML SOLN COMPARISON:  None Available. FINDINGS: Cardiovascular: There is adequate opacification of the pulmonary arteries to the segmental level. There is limited evaluation in the lower lungs, particularly on the right, secondary to respiratory motion artifact. There is no definite pulmonary embolism. Mediastinum/Nodes: Thyroid gland is surgically absent. There are no enlarged lymph nodes identified. There some prominent, but nonenlarged right hilar lymph nodes present. Esophagus is unremarkable. Lungs/Pleura: Tree-in-bud opacities are seen minimally in the inferior right upper lobe scattered throughout the right lower lobe. There are additional patchy ground-glass and airspace opacities in the bilateral lower lobes, right greater than left, and minimally in the right middle lobe. No pleural effusion or pneumothorax. Upper Abdomen: No acute abnormality. Musculoskeletal: No chest wall abnormality. No acute or significant osseous findings. Review of the MIP images confirms the above findings. IMPRESSION: 1. Limited evaluation of the lower lungs secondary to respiratory motion artifact. No definite pulmonary embolism. 2. Tree-in-bud opacities in the right upper lobe and right lower lobe with additional patchy ground-glass and airspace opacities in the bilateral lower lobes, right greater than left, and minimally in  the right middle lobe. Findings are most compatible with multifocal pneumonia. Recommend follow-up imaging to resolution. 3. Prominent, but nonenlarged right hilar lymph nodes are likely reactive. Electronically Signed   By: Darliss Cheney M.D.   On: 01/26/2023 23:27   DG Chest 2 View  Result Date: 01/26/2023 CLINICAL DATA:  Shortness of breath EXAM: CHEST - 2 VIEW COMPARISON:  None Available. FINDINGS: Faint right lung base densities may represent atelectasis or infiltrate. There is mild chronic bronchitic changes. No consolidative changes. There is no pleural effusion or pneumothorax. The cardiac silhouette is within normal limits. Atherosclerotic calcification of the aorta. No acute osseous pathology. IMPRESSION: Right lung base atelectasis or infiltrate. Electronically Signed   By: Elgie Collard M.D.   On: 01/26/2023 21:56    Microbiology: Results for orders placed or performed during the hospital encounter of 01/26/23  SARS Coronavirus 2 by RT PCR (hospital order, performed in [facility] hospital lab) *cepheid single result test* Anterior Nasal Swab     Status: None   Collection Time: 01/26/23  8:32 PM   Specimen: Anterior Nasal Swab  Result Value Ref Range Status   SARS Coronavirus 2 by RT PCR NEGATIVE NEGATIVE Final    Comment: (NOTE) SARS-CoV-2 target nucleic acids are NOT DETECTED.  The SARS-CoV-2 RNA is generally detectable in upper and lower respiratory specimens during the acute phase of infection. The lowest concentration of SARS-CoV-2 viral copies this assay can detect is 250 copies / mL. A negative result does not preclude SARS-CoV-2 infection and should  not be used as the sole basis for treatment or other patient management decisions.  A negative result may occur with improper specimen collection / handling, submission of specimen other than nasopharyngeal swab, presence of viral mutation(s) within the areas targeted by this assay, and inadequate number of viral  copies (<250 copies / mL). A negative result must be combined with clinical observations, patient history, and epidemiological information.  Fact Sheet for Patients:   RoadLapTop.co.za  Fact Sheet for Healthcare Providers: http://kim-miller.com/  This test is not yet approved or  cleared by the [place] FDA and has been authorized for detection and/or diagnosis of SARS-CoV-2 by FDA under an Emergency Use Authorization (EUA).  This EUA will remain in effect (meaning this test can be used) for the duration of the COVID-19 declaration under Section 564(b)(1) of the Act, 21 U.S.C. section 360bbb-3(b)(1), unless the authorization is terminated or revoked sooner.  Performed at [facility], [address], [place], [place] [address]   Respiratory (~20 pathogens) panel by PCR     Status: Abnormal   Collection Time: 01/27/23  2:39 AM   Specimen: Nasopharyngeal Swab; Respiratory  Result Value Ref Range Status   Adenovirus NOT DETECTED NOT DETECTED Final   Coronavirus 229E NOT DETECTED NOT DETECTED Final    Comment: (NOTE) The Coronavirus on the Respiratory Panel, DOES NOT test for the novel  Coronavirus (2019 nCoV)    Coronavirus HKU1 NOT DETECTED NOT DETECTED Final   Coronavirus NL63 NOT DETECTED NOT DETECTED Final   Coronavirus OC43 NOT DETECTED NOT DETECTED Final   Metapneumovirus NOT DETECTED NOT DETECTED Final   Rhinovirus / Enterovirus NOT DETECTED NOT DETECTED Final   Influenza A NOT DETECTED NOT DETECTED Final   Influenza B NOT DETECTED NOT DETECTED Final   Parainfluenza Virus 1 NOT DETECTED NOT DETECTED Final   Parainfluenza Virus 2 NOT DETECTED NOT DETECTED Final   Parainfluenza Virus 3 NOT DETECTED NOT DETECTED Final   Parainfluenza Virus 4 NOT DETECTED NOT DETECTED Final   Respiratory Syncytial Virus NOT DETECTED NOT DETECTED Final   Bordetella pertussis NOT DETECTED NOT DETECTED Final   Bordetella  Parapertussis NOT DETECTED NOT DETECTED Final   Chlamydophila pneumoniae NOT DETECTED NOT DETECTED Final   Mycoplasma pneumoniae DETECTED (A) NOT DETECTED Final    Comment: Performed at [facility], [address] [place], [place] [address]    Labs: CBC: Recent Labs  Lab 01/26/23 2043 01/27/23 0421 01/28/23 0423 01/29/23 0449  WBC 16.8* 17.2* 20.4* 15.9*  NEUTROABS 14.1*  --   --   --   HGB 14.8 13.5 13.6 13.5  HCT 45.6 41.2 42.2 41.9  MCV 86.5 84.6 85.8 85.2  PLT 416* 436* 426* 528*   Basic Metabolic Panel: Recent Labs  Lab 01/26/23 2043 01/27/23 0421 01/28/23 0423  NA 136 135  --   K 3.4* 2.8* 4.1  CL 98 97*  --   CO2 24 24  --   GLUCOSE 104* 167*  --   BUN 5* 6*  --   CREATININE 0.51 0.61  --   CALCIUM 8.9 8.5*  --   MG  --  2.0  --    Liver Function Tests: Recent Labs  Lab 01/26/23 2043 01/27/23 0421  AST 48* 33  ALT 37 30  ALKPHOS 109 98  BILITOT 0.9 0.4  PROT 8.0 7.2  ALBUMIN 3.6 3.1*    Discharge time spent:  35 minutes.  Signed: Jacquelin Hawking, MD [facility] 01/29/2023

## 2023-01-29 NOTE — Plan of Care (Signed)

## 2023-01-29 NOTE — Discharge Instructions (Signed)
Kelly Ingram,  You were in the hospital with pneumonia. This has been treated with antibiotics. Thankfully, you are not requiring oxygen. We discussed doing a few things to minimize your coughing:  Breathe through your nose as able Stay hydrated Consider chewing sugarless gum or something else to stimulate saliva production Prescribed medications as needed  If your cough persists for several weeks, your PCP may consider referring you to a pulmonologist if required. Please continue your antibiotics as prescribed.

## 2023-01-29 NOTE — Hospital Course (Signed)
Kelly Ingram is a 72 y.o. female with a history of asthma, thyroid cancer status post total thyroidectomy with resultant hypothyroidism, vocal cord dysfunction status post vocal cord implant, GERD, IBS, seasonal allergies.  Patient presented secondary to shortness of breath and was found to have evidence of multifocal pneumonia concerning for atypical infection.  Patient was started on empiric treatment for community-acquired pneumonia with ceftriaxone and azithromycin.  Patient has recently been treated for pneumonia with azithromycin but did not complete her course.  COVID-19 testing was negative.  Respiratory virus panel was positive for mycoplasma pneumoniae. Patient improved with antibiotic treatment and supportive care for cough. Patient completed azithromycin prior to discharge and will discharge on Cefdinir to complete 5 days of total antibiotic treatment.

## 2023-02-03 DIAGNOSIS — J157 Pneumonia due to Mycoplasma pneumoniae: Secondary | ICD-10-CM | POA: Diagnosis not present

## 2023-02-03 DIAGNOSIS — J189 Pneumonia, unspecified organism: Secondary | ICD-10-CM | POA: Diagnosis not present

## 2023-02-03 DIAGNOSIS — J9601 Acute respiratory failure with hypoxia: Secondary | ICD-10-CM | POA: Diagnosis not present

## 2023-02-03 DIAGNOSIS — E890 Postprocedural hypothyroidism: Secondary | ICD-10-CM | POA: Diagnosis not present

## 2023-02-03 DIAGNOSIS — I700 Atherosclerosis of aorta: Secondary | ICD-10-CM | POA: Diagnosis not present

## 2023-02-14 DIAGNOSIS — H9192 Unspecified hearing loss, left ear: Secondary | ICD-10-CM | POA: Diagnosis not present

## 2023-02-14 DIAGNOSIS — J3089 Other allergic rhinitis: Secondary | ICD-10-CM | POA: Diagnosis not present

## 2023-02-14 DIAGNOSIS — H6122 Impacted cerumen, left ear: Secondary | ICD-10-CM | POA: Diagnosis not present

## 2023-03-07 DIAGNOSIS — J157 Pneumonia due to Mycoplasma pneumoniae: Secondary | ICD-10-CM | POA: Diagnosis not present

## 2023-03-07 DIAGNOSIS — J189 Pneumonia, unspecified organism: Secondary | ICD-10-CM | POA: Diagnosis not present

## 2023-03-09 DIAGNOSIS — H524 Presbyopia: Secondary | ICD-10-CM | POA: Diagnosis not present

## 2023-04-21 DIAGNOSIS — M533 Sacrococcygeal disorders, not elsewhere classified: Secondary | ICD-10-CM | POA: Diagnosis not present

## 2023-05-31 DIAGNOSIS — M545 Low back pain, unspecified: Secondary | ICD-10-CM | POA: Diagnosis not present

## 2023-05-31 DIAGNOSIS — M533 Sacrococcygeal disorders, not elsewhere classified: Secondary | ICD-10-CM | POA: Diagnosis not present

## 2023-06-15 DIAGNOSIS — Z1212 Encounter for screening for malignant neoplasm of rectum: Secondary | ICD-10-CM | POA: Diagnosis not present

## 2023-06-20 DIAGNOSIS — M533 Sacrococcygeal disorders, not elsewhere classified: Secondary | ICD-10-CM | POA: Diagnosis not present

## 2023-06-22 DIAGNOSIS — E890 Postprocedural hypothyroidism: Secondary | ICD-10-CM | POA: Diagnosis not present

## 2023-06-22 DIAGNOSIS — E785 Hyperlipidemia, unspecified: Secondary | ICD-10-CM | POA: Diagnosis not present

## 2023-06-22 DIAGNOSIS — L80 Vitiligo: Secondary | ICD-10-CM | POA: Diagnosis not present

## 2023-06-22 DIAGNOSIS — R82998 Other abnormal findings in urine: Secondary | ICD-10-CM | POA: Diagnosis not present

## 2023-06-22 DIAGNOSIS — C73 Malignant neoplasm of thyroid gland: Secondary | ICD-10-CM | POA: Diagnosis not present

## 2023-06-22 DIAGNOSIS — M8580 Other specified disorders of bone density and structure, unspecified site: Secondary | ICD-10-CM | POA: Diagnosis not present

## 2023-06-22 DIAGNOSIS — R251 Tremor, unspecified: Secondary | ICD-10-CM | POA: Diagnosis not present

## 2023-06-22 DIAGNOSIS — Z0000 Encounter for general adult medical examination without abnormal findings: Secondary | ICD-10-CM | POA: Diagnosis not present

## 2023-06-22 DIAGNOSIS — Z1339 Encounter for screening examination for other mental health and behavioral disorders: Secondary | ICD-10-CM | POA: Diagnosis not present

## 2023-06-22 DIAGNOSIS — Z1331 Encounter for screening for depression: Secondary | ICD-10-CM | POA: Diagnosis not present

## 2023-06-22 DIAGNOSIS — K219 Gastro-esophageal reflux disease without esophagitis: Secondary | ICD-10-CM | POA: Diagnosis not present

## 2023-06-27 DIAGNOSIS — M25559 Pain in unspecified hip: Secondary | ICD-10-CM | POA: Diagnosis not present

## 2023-07-05 DIAGNOSIS — M533 Sacrococcygeal disorders, not elsewhere classified: Secondary | ICD-10-CM | POA: Diagnosis not present

## 2023-07-25 ENCOUNTER — Other Ambulatory Visit: Payer: Self-pay | Admitting: Endocrinology

## 2023-07-25 DIAGNOSIS — Z1231 Encounter for screening mammogram for malignant neoplasm of breast: Secondary | ICD-10-CM

## 2023-07-27 DIAGNOSIS — R42 Dizziness and giddiness: Secondary | ICD-10-CM | POA: Diagnosis not present

## 2023-08-08 DIAGNOSIS — M7061 Trochanteric bursitis, right hip: Secondary | ICD-10-CM | POA: Diagnosis not present

## 2023-08-08 DIAGNOSIS — M533 Sacrococcygeal disorders, not elsewhere classified: Secondary | ICD-10-CM | POA: Diagnosis not present

## 2023-08-22 ENCOUNTER — Ambulatory Visit

## 2023-08-25 ENCOUNTER — Ambulatory Visit
Admission: RE | Admit: 2023-08-25 | Discharge: 2023-08-25 | Disposition: A | Discharge status: Home / Self Care | Source: Ambulatory Visit | Attending: Endocrinology | Admitting: Endocrinology

## 2023-08-25 DIAGNOSIS — Z1231 Encounter for screening mammogram for malignant neoplasm of breast: Secondary | ICD-10-CM | POA: Diagnosis not present

## 2023-09-12 ENCOUNTER — Other Ambulatory Visit: Payer: Self-pay

## 2023-09-12 ENCOUNTER — Emergency Department (HOSPITAL_COMMUNITY)

## 2023-09-12 ENCOUNTER — Emergency Department (HOSPITAL_COMMUNITY)
Admission: EM | Admit: 2023-09-12 | Discharge: 2023-09-12 | Disposition: A | Discharge status: Home / Self Care | Attending: Emergency Medicine | Admitting: Emergency Medicine

## 2023-09-12 ENCOUNTER — Encounter (HOSPITAL_COMMUNITY): Payer: Self-pay

## 2023-09-12 DIAGNOSIS — R0789 Other chest pain: Secondary | ICD-10-CM | POA: Diagnosis not present

## 2023-09-12 DIAGNOSIS — R079 Chest pain, unspecified: Secondary | ICD-10-CM | POA: Diagnosis not present

## 2023-09-12 LAB — CBC
HCT: 44.1 % (ref 36.0–46.0)
Hemoglobin: 14.3 g/dL (ref 12.0–15.0)
MCH: 28.8 pg (ref 26.0–34.0)
MCHC: 32.4 g/dL (ref 30.0–36.0)
MCV: 88.7 fL (ref 80.0–100.0)
Platelets: 296 10*3/uL (ref 150–400)
RBC: 4.97 MIL/uL (ref 3.87–5.11)
RDW: 14.3 % (ref 11.5–15.5)
WBC: 7.4 10*3/uL (ref 4.0–10.5)
nRBC: 0 % (ref 0.0–0.2)

## 2023-09-12 LAB — BASIC METABOLIC PANEL WITH GFR
Anion gap: 9 (ref 5–15)
BUN: 14 mg/dL (ref 8–23)
CO2: 28 mmol/L (ref 22–32)
Calcium: 9.3 mg/dL (ref 8.9–10.3)
Chloride: 105 mmol/L (ref 98–111)
Creatinine, Ser: 0.57 mg/dL (ref 0.44–1.00)
GFR, Estimated: >60 mL/min (ref >60)
Glucose, Bld: 89 mg/dL (ref 70–99)
Potassium: 4.3 mmol/L (ref 3.5–5.1)
Sodium: 142 mmol/L (ref 135–145)

## 2023-09-12 LAB — TROPONIN I (HIGH SENSITIVITY)
Troponin I (High Sensitivity): 4 ng/L (ref <18)
Troponin I (High Sensitivity): 5 ng/L (ref <18)

## 2023-09-12 NOTE — ED Notes (Signed)
 Pt ambulated to the restroom.

## 2023-09-12 NOTE — ED Provider Notes (Signed)
 [place] EMERGENCY DEPARTMENT AT [facility] Provider Note   CSN: [id number] Arrival date & time: 09/12/23  6578     History  Chief Complaint  Patient presents with   Chest Pain    Kelly Ingram is a 73 y.o. female.  73 year old female presenting with chest pain.  Symptoms began around 7 AM shortly after patient woke up, she describes "compression" like pain over her right chest, lasts several seconds then resolves, returns 5 to 10 minutes later.  This has been ongoing since this morning, is not exacerbated by activity.  Denies radiation of pain, nausea, vomiting, abdominal pain, lower extremity edema, shortness of breath, cough, fevers, diaphoresis.  No pertinent cardiac history, was hospitalized with pneumonia in September 2024 for but recovered well and has had no issues since then.  She she cannot recall any activity over the last several days that may have triggered these symptoms.   Chest Pain Associated symptoms: no cough, no diaphoresis, no fever and no shortness of breath        Home Medications Prior to Admission medications   Medication Sig Start Date End Date Taking? Authorizing Provider  acetaminophen  (TYLENOL ) 500 MG tablet Take 500 mg by mouth every 6 (six) hours as needed for moderate pain.    [provider]  benzonatate  (TESSALON ) 200 MG capsule Take 1 capsule (200 mg total) by mouth 3 (three) times daily as needed for cough. 01/29/23   Verlyn Goad, MD  Cholecalciferol (VITAMIN D3) 10 MCG (400 UNIT) CAPS Take 1 capsule by mouth daily.    [provider]  fexofenadine (ALLEGRA) 180 MG tablet Take 180 mg by mouth daily.    [provider]  fluticasone  (FLONASE ) 50 MCG/ACT nasal spray Place 1 spray into both nostrils daily as needed for allergies or rhinitis.    [provider]  hydrocortisone (ANUSOL-HC) 2.5 % rectal cream Place 1 application rectally as needed.  02/09/18   [provider]   levothyroxine  (SYNTHROID , LEVOTHROID) 125 MCG tablet Take 62.5-125 mcg by mouth as directed. Take 1 tablet daily for 6 Days and On Sundays Take 1/2 tablet (62.mcg)    [provider]  Magnesium 100 MG CAPS Take 1 capsule by mouth daily.    [provider]  Misc Natural Products (JOINT HEALTH PO) Take 2 tablets by mouth daily.    [provider]  Multiple Vitamin (STRESSTABS PO) Take 1 tablet by mouth daily.    [provider]  Multiple Vitamins-Minerals (ZINC PO) Take by mouth.    [provider]  Probiotic Product (PROBIOTIC DAILY PO) Take 1 capsule by mouth daily.    [provider]  TURMERIC PO Take 1 tablet by mouth daily.     [provider]  VENTOLIN  HFA 108 (90 Base) MCG/ACT inhaler 2 puff q 4-6 hours prn cough/wheeze *may sub proair  if better coverage* Inhalation every 4 hrs for 30 days 06/17/17   [provider]  VEOZAH  45 MG TABS Take 1 tablet by mouth every evening. 06/24/22   [provider]      Allergies    Effexor  [venlafaxine ], Erythromycin, Gluten meal, and Adhesive [tape]    Review of Systems   Review of Systems  Constitutional:  Negative for diaphoresis and fever.  Respiratory:  Negative for cough and shortness of breath.   Cardiovascular:  Positive for chest pain.    Physical Exam Updated Vital Signs  Vitals:   09/12/23 0957 09/12/23 1001 09/12/23 1230 09/12/23  1350  BP: (!) 176/81  (!) 150/66 (!) 154/63  Pulse: 68  (!) 56 (!) 55  Resp: 16  15 12   Temp: 98.2 F (36.8 C)   97.6 F (36.4 C)  TempSrc: Oral   Oral  SpO2: 99%  100% 100%  Weight:  70.8 kg    Height:  5\' 3"  (1.6 m)      Physical Exam Vitals and nursing note reviewed.  Constitutional:      General: She is not in acute distress. HENT:     Head: Normocephalic and atraumatic.  Eyes:     Extraocular Movements: Extraocular movements intact.     Conjunctiva/sclera: Conjunctivae normal.  Cardiovascular:     Rate and  Rhythm: Normal rate and regular rhythm.     Pulses:          Radial pulses are 2+ on the right side and 2+ on the left side.     Heart sounds: Normal heart sounds. No murmur heard. Pulmonary:     Effort: Pulmonary effort is normal. No respiratory distress.     Breath sounds: Normal breath sounds.  Abdominal:     Palpations: Abdomen is soft.     Tenderness: There is no abdominal tenderness.  Musculoskeletal:        General: No swelling.     Cervical back: Neck supple.     Right lower leg: No edema.     Left lower leg: No edema.  Skin:    General: Skin is warm and dry.     Capillary Refill: Capillary refill takes less than 2 seconds.  Neurological:     Mental Status: She is alert.  Psychiatric:        Mood and Affect: Mood normal.     ED Results / Procedures / Treatments   Labs (all labs ordered are listed, but only abnormal results are displayed) Labs Reviewed  BASIC METABOLIC PANEL WITH GFR  CBC  TROPONIN I (HIGH SENSITIVITY)  TROPONIN I (HIGH SENSITIVITY)    EKG None  Radiology DG Chest 2 View Result Date: 09/12/2023 CLINICAL DATA:  Right-sided chest pain EXAM: CHEST - 2 VIEW COMPARISON:  None Available. FINDINGS: The heart size and mediastinal contours are within normal limits. Both lungs are clear. Surgical clips seen at the level of the thoracic inlet, likely from prior thyroidectomy. The visualized skeletal structures are unremarkable. IMPRESSION: No active cardiopulmonary disease. Electronically Signed   By: Marlyce Sine M.D.   On: 09/12/2023 11:28    Procedures Procedures    Medications Ordered in ED Medications - No data to display  ED Course/ Medical Decision Making/ A&P                                 Medical Decision Making This patient presents to the ED for concern of chest pain, this involves an extensive number of treatment options, and is a complaint that carries with it a high risk of complications and morbidity.  The differential diagnosis  includes ACS, stable vs unstable angina, GERD.     Additional history obtained:  External records from outside source obtained and reviewed including hospital discharge summary from last fall   Lab Tests:  I Ordered, and personally interpreted labs.  The pertinent results include:  CBC and CMP unremarkable.  Initial troponin 4, with subsequent result of 5.    Imaging Studies ordered:  I ordered imaging studies including chest x-ray I  independently visualized and interpreted imaging which showed no acute cardiopulmonary findings I agree with the radiologist interpretation   Cardiac Monitoring: / EKG:  The patient was maintained on a cardiac monitor.  I personally viewed and interpreted the cardiac monitored which showed an underlying rhythm of: sinus rhythm  Problem List / ED Course / Critical interventions / Medication management  I have reviewed the patients home medicines and have made adjustments as needed   Test / Admission - Considered:  Physical exam is reassuring, pain is intermittent in nature lasting mere seconds before resolving, then returns 5 to 10 minutes later.  Pain is not reproducible, is not exacerbated by specific activity.  Per patient pain is becoming more few and far between, ~39min or more apart and lasting for mere seconds.  Heart score of 2 places patient at low risk of major adverse cardiac event.  As of 3pm reassessment patient has not had return of pain in several hours. Repeat troponin of 5, low suspicion for ACS. Recommend cardiology follow-up in the outpatient setting for additional work-up.Patient and her husband are agreeable with this plan.  Return precautions discussed.    Amount and/or Complexity of Data Reviewed Labs: ordered. Radiology: ordered.           Final Clinical Impression(s) / ED Diagnoses Final diagnoses:  Chest pain, unspecified type    Rx / DC Orders ED Discharge Orders          Ordered    Ambulatory referral to  Cardiology       Comments: If you have not heard from the Cardiology office within the next 72 hours please call [phone].   09/12/23 1337              Kendrick Pax, PA-C 09/12/23 1526    Iva Mariner, MD 09/12/23 [phone]

## 2023-09-12 NOTE — ED Triage Notes (Signed)
 Pt arrived reporting chest pain, right side onset this morning. Pt states its a "squeezing" intermittent pain. Denies cardiac hx. No dizziness, shob or other symptoms.

## 2023-09-12 NOTE — ED Notes (Signed)
 Pt provided discharge instructions and prescription information. Pt was given the opportunity to ask questions and questions were answered.

## 2023-09-12 NOTE — Discharge Instructions (Addendum)
 Return to the Emergency Department if your symptoms worsen or you experience shortness of breath, loss of consciousness, palpitations, nausea, vomiting, sweating.  Follow-up with Cardiology as discussed. Follow-up with your PCP.

## 2023-09-30 ENCOUNTER — Encounter: Payer: Self-pay | Admitting: Cardiology

## 2023-09-30 NOTE — Progress Notes (Unsigned)
 Cardiology Office Note:  .   Date:  10/03/2023  ID:  Kelly Ingram, DOB [birth year], MRN [id number] PCP: Rosslyn Coons, MD  [facility] Providers Cardiologist:  None    History of Present Illness: Aaron Aas   Makaylen Thieme is a 73 y.o. female.  Discussed the use of AI scribe software for clinical note transcription with the patient, who gave verbal consent to proceed.  History of Present Illness Kelly Ingram is a 73 year old female who presents with chest pain.  She experienced chest pain on September 12, 2023, around 7 AM, described as a compression-like sensation over the right chest, lasting several seconds and recurring every five to ten minutes. The pain was not exacerbated by activity and did not radiate. Evaluation in the ER included negative troponins and normal laboratory studies. No medications were administered, and she has not experienced any pain since discharge.  She had a recent alert from an Apple watch suggesting possible atrial fibrillation, though she feels fine with no symptoms. She has occasional palpitations, described as a sensation that sometimes induces coughing, but no rapid heartbeats or other symptoms typical of AFib.  Her last lipid panel in January 2025 showed an LDL of 137, triglycerides of 74, and HDL of 96. She is not very active, with occasional walks and Qigong exercises, attributing her reduced activity to a busy schedule and stress.    ROS: negative except per HPI above.  Studies Reviewed: Aaron Aas   EKG Interpretation Date/Time:  Monday Oct 03 2023 13:02:45 EDT Ventricular Rate:  68 PR Interval:  152 QRS Duration:  104 QT Interval:  410 QTC Calculation: 435 R Axis:   -79  Text Interpretation: Sinus rhythm with pacs Incomplete right bundle branch block Left anterior fascicular block Confirmed by Grady Lawman ([id number]) on 10/03/2023 1:25:32 PM    Results LABS Troponins: negative times two (09/12/2023) Lipid panel: LDL 137  mg/dL, triglycerides 74 mg/dL, HDL 96 mg/dL (46/9629)  RADIOLOGY CT chest PE study: No significant coronary artery calcifications (01/2023) Risk Assessment/Calculations:    CHA2DS2-VASc Score = 2   This indicates a 2.2% annual risk of stroke. The patient's score is based upon: CHF History: 0 HTN History: 0 Diabetes History: 0 Stroke History: 0 Vascular Disease History: 0 Age Score: 1 Gender Score: 1       Physical Exam:   VS:  BP 134/82 (BP Location: Left Arm, Patient Position: Sitting, Cuff Size: Normal)   Pulse 68   Ht 5\' 3"  (1.6 m)   Wt 146 lb 4.8 oz (66.4 kg)   LMP 05/17/1998 Comment: pt had bleeding from polyp 2/15  SpO2 97%   BMI 25.92 kg/m    Wt Readings from Last 3 Encounters:  10/03/23 146 lb 4.8 oz (66.4 kg)  09/12/23 156 lb 1.4 oz (70.8 kg)  01/26/23 156 lb 1.4 oz (70.8 kg)     Physical Exam GENERAL: Alert, cooperative, well developed, no acute distress HEENT: Normocephalic, normal oropharynx, moist mucous membranes. Mild inspiratory stridor. CHEST: Clear to auscultation bilaterally, no wheezes, rhonchi, or crackles CARDIOVASCULAR: Normal heart rate and rhythm, S1 and S2 normal without murmurs ABDOMEN: Soft, non-tender, non-distended, without organomegaly, normal bowel sounds EXTREMITIES: No cyanosis or edema NEUROLOGICAL: Cranial nerves grossly intact, moves all extremities without gross motor or sensory deficit   ASSESSMENT AND PLAN: .    Assessment and Plan Assessment & Plan Chest pain Intermittent right-sided chest pain with negative troponins and normal labs. CT chest showed no significant coronary artery  calcifications. Discussed esophageal or muscle spasm as potential causes. Given HLD and age, intermediate risk for obstructive CAD.  - Order CCTA scan of the heart to evaluate for blockages. - Order echocardiogram to assess heart function and rule out structural abnormalities. - Advise hydration prior to CT scan to facilitate IV  access.  Premature atrial contractions (PACs) EKG shows PACs. No atrial fibrillation on current EKG, but Apple Watch suggested possible Afib, suspect this is also PACs with poorly visualized p wave. Discussed potential for silent AFib and implications for anticoagulation if confirmed. - Order 30-day heart monitor to screen for atrial fibrillation. - Discussed the option of using a KardiaMobile device for home monitoring of AFib. - Advise to limit caffeine intake as it may exacerbate PACs.  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 Implant assists vocal cord function, causing some airway restriction and shortness of breath with talking and activity.  General Health Maintenance Discussed cholesterol management contingent on CT scan results. Lenient approach if no plaque or calcium; aggressive strategy if significant plaque is present. - Schedule follow-up appointment after CT scan and heart monitor results are available.      Grady Lawman, MD, FACC

## 2023-10-03 ENCOUNTER — Encounter: Payer: Self-pay | Admitting: *Deleted

## 2023-10-03 ENCOUNTER — Ambulatory Visit: Attending: Internal Medicine | Admitting: Internal Medicine

## 2023-10-03 VITALS — BP 134/82 | HR 68 | Ht 63.0 in | Wt 146.3 lb

## 2023-10-03 DIAGNOSIS — R079 Chest pain, unspecified: Secondary | ICD-10-CM | POA: Diagnosis not present

## 2023-10-03 DIAGNOSIS — R002 Palpitations: Secondary | ICD-10-CM | POA: Diagnosis not present

## 2023-10-03 MED ORDER — METOPROLOL TARTRATE 50 MG PO TABS: 50.0000 mg | ORAL_TABLET | Freq: Once | ORAL | 0 refills | Status: AC

## 2023-10-03 NOTE — Patient Instructions (Signed)
 Medication Instructions:  No Changes  Lab Work: None If you have labs (blood work) drawn today and your tests are completely normal, you will receive your results only by: MyChart Message (if you have MyChart) OR A paper copy in the mail If you have any lab test that is abnormal or we need to change your treatment, we will call you to review the results.   Testing/Procedures:  [employer] Cardiac Event Monitor Instructions  Your physician has requested you wear your cardiac event monitor for __30___ days. [employer] may call or text to confirm a shipping address. The monitor will be sent to a land address via UPS. [employer] will not ship a monitor to a PO BOX. It typically takes 3-5 days to receive your monitor after it has been enrolled. [employer] will assist with USPS tracking if your package is delayed. The telephone number for [employer] is [phone]. Once you have received your monitor, please review the enclosed instructions. Instruction tutorials can also be viewed under help and settings on the enclosed cell phone. Your monitor has already been registered assigning a specific monitor serial # to you.  Billing and Self Pay Discount Information  [employer] has been provided the insurance information we had on file for you.  If your insurance has been updated, please call [employer] at [phone] to provide them with your updated insurance information.   [employer] offers a discounted Self Pay option for patients who have insurance that does not cover their cardiac event monitor or patients without insurance.  The discounted cost of a Self Pay Cardiac Event Monitor would be $225.00 , if the patient contacts [employer] at [phone] within 7 days of applying the monitor to make payment arrangements.  If the patient does not contact [employer] within 7 days of applying the monitor, the cost of the cardiac event monitor will be $350.00.  Applying the monitor  Remove cell  phone from case and turn it on. The cell phone works as IT consultant and needs to be within [employer] of you at all times. The cell phone will need to be charged on a daily basis. We recommend you plug the cell phone into the enclosed charger at your bedside table every night.  Monitor batteries: You will receive two monitor batteries labelled #1 and #2. These are your recorders. Plug battery #2 onto the second connection on the enclosed charger. Keep one battery on the charger at all times. This will keep the monitor battery deactivated. It will also keep it fully charged for when you need to switch your monitor batteries. A small light will be blinking on the battery emblem when it is charging. The light on the battery emblem will remain on when the battery is fully charged.  Open package of a Monitor strip. Insert battery #1 into black hood on strip and gently squeeze monitor battery onto connection as indicated in instruction booklet. Set aside while preparing skin.  Choose location for your strip, vertical or horizontal, as indicated in the instruction booklet. Shave to remove all hair from location. There cannot be any lotions, oils, powders, or colognes on skin where monitor is to be applied. Wipe skin clean with enclosed Saline wipe. Dry skin completely.  Peel paper labeled #1 off the back of the Monitor strip exposing the adhesive. Place the monitor on the chest in the vertical or horizontal position shown in the instruction booklet. One arrow on the monitor strip must be pointing upward. Carefully remove paper labeled #2, attaching remainder  of strip to your skin. Try not to create any folds or wrinkles in the strip as you apply it.  Firmly press and release the circle in the center of the monitor battery. You will hear a small beep. This is turning the monitor battery on. The heart emblem on the monitor battery will light up every 5 seconds if the monitor battery in turned on and  connected to the patient securely. Do not push and hold the circle down as this turns the monitor battery off. The cell phone will locate the monitor battery. A screen will appear on the cell phone checking the connection of your monitor strip. This may read poor connection initially but change to good connection within the next minute. Once your monitor accepts the connection you will hear a series of 3 beeps followed by a climbing crescendo of beeps. A screen will appear on the cell phone showing the two monitor strip placement options. Touch the picture that demonstrates where you applied the monitor strip.  Your monitor strip and battery are waterproof. You are able to shower, bathe, or swim with the monitor on. They just ask you do not submerge deeper than 3 feet underwater. We recommend removing the monitor if you are swimming in a lake, river, or ocean.  Your monitor battery will need to be switched to a fully charged monitor battery approximately once a week. The cell phone will alert you of an action which needs to be made.  On the cell phone, tap for details to reveal connection status, monitor battery status, and cell phone battery status. The green dots indicates your monitor is in good status. A red dot indicates there is something that needs your attention.  To record a symptom, click the circle on the monitor battery. In 30-60 seconds a list of symptoms will appear on the cell phone. Select your symptom and tap save. Your monitor will record a sustained or significant arrhythmia regardless of you clicking the button. Some patients do not feel the heart rhythm irregularities. [employer] will notify us  of any serious or critical events.  Refer to instruction booklet for instructions on switching batteries, changing strips, the Do not disturb or Pause features, or any additional questions.  Call [employer] at [phone], to confirm your monitor is transmitting and record  your baseline. They will answer any questions you may have regarding the monitor instructions at that time.  Returning the monitor to [employer]  Place all equipment back into blue box. Peel off strip of paper to expose adhesive and close box securely. There is a prepaid UPS shipping label on this box. Drop in a UPS drop box, or at a UPS facility like [employer]. You may also contact [employer] to arrange UPS to pick up monitor package at your home.      Your cardiac CT will be scheduled at one of the below locations:   [facility] [address] [place], [place] [address] [phone]  OR   Jeralene Mom. [facility] and Vascular Tower [address]  [place], [place] [address] Opening September 12, 2023  If scheduled at [facility], please arrive at the [facility] and Children's Entrance (Entrance C2) of [facility] 30 minutes prior to test start time. You can use the FREE valet parking offered at entrance C (encouraged to control the heart rate for the test)  Proceed to the [facility] Radiology Department (first floor) to check-in and test prep.   All radiology patients  and guests should use entrance C2 at [facility], accessed from [facility], even though the hospital's physical address listed is [address].    If scheduled at the Heart and Vascular Tower at [address], please enter the parking lot using the [address] entrance and use the FREE valet service at the patient drop-off area. Enter the buidling and check-in with registration on the main floor.   Please follow these instructions carefully (unless otherwise directed):  An IV will be required for this test and Nitroglycerin will be given.   On the Night Before the Test: Be sure to Drink plenty of water. Do not consume any caffeinated/decaffeinated beverages or chocolate 12 hours prior to your test. Do not take any antihistamines 12 hours prior to your  test.  On the Day of the Test: Drink plenty of water until 1 hour prior to the test. Do not eat any food 1 hour prior to test. You may take your regular medications prior to the test.  Take metoprolol  (Lopressor ) 50 mg two hours prior to test. If you take Furosemide/Hydrochlorothiazide/Spironolactone/Chlorthalidone, please HOLD on the morning of the test. Patients who wear a continuous glucose monitor MUST remove the device prior to scanning. FEMALES- please wear underwire-free bra if available, avoid dresses & tight clothing       After the Test: Drink plenty of water. After receiving IV contrast, you may experience a mild flushed feeling. This is normal. On occasion, you may experience a mild rash up to 24 hours after the test. This is not dangerous. If this occurs, you can take Benadryl 25 mg, Zyrtec, Claritin , or Allegra and increase your fluid intake. (Patients taking Tikosyn should avoid Benadryl, and may take Zyrtec, Claritin , or Allegra) If you experience trouble breathing, this can be serious. If it is severe call 911 IMMEDIATELY. If it is mild, please call our office.  We will call to schedule your test 2-4 weeks out understanding that some insurance companies will need an authorization prior to the service being performed.   For more information and frequently asked questions, please visit our website : [email or web address]  For non-scheduling related questions, please contact the cardiac imaging nurse navigator should you have any questions/concerns: Cardiac Imaging Nurse Navigators Direct Office Dial: [phone]   For scheduling needs, including cancellations and rescheduling, please call [place], [phone].   Follow-Up: At [facility], you and your health needs are our priority.  As part of our continuing mission to provide you with exceptional heart care, our providers are all part of one team.  This team includes your primary Cardiologist  (physician) and Advanced Practice Providers or APPs (Physician Assistants and Nurse Practitioners) who all work together to provide you with the care you need, when you need it.  Your next appointment:   6 -8 week(s)  Provider:   Dr. Acharya or Callie Goodrich, PA, or [place], [place], or Marlana Silvan, NP, or Liane Redman, [place]

## 2023-10-03 NOTE — Progress Notes (Unsigned)
Patient enrolled for [employer]/ [employer] to ship a 30 day cardiac event monitor to her address on file. 

## 2023-10-11 DIAGNOSIS — I480 Paroxysmal atrial fibrillation: Secondary | ICD-10-CM | POA: Diagnosis not present

## 2023-10-18 ENCOUNTER — Encounter (HOSPITAL_COMMUNITY): Payer: Self-pay

## 2023-10-20 ENCOUNTER — Ambulatory Visit (HOSPITAL_COMMUNITY)
Admission: RE | Admit: 2023-10-20 | Discharge: 2023-10-20 | Disposition: A | Discharge status: Home / Self Care | Source: Ambulatory Visit | Attending: Internal Medicine | Admitting: Internal Medicine

## 2023-10-20 DIAGNOSIS — R079 Chest pain, unspecified: Secondary | ICD-10-CM

## 2023-10-20 DIAGNOSIS — R072 Precordial pain: Secondary | ICD-10-CM | POA: Diagnosis not present

## 2023-10-20 MED ORDER — NITROGLYCERIN 0.4 MG SL SUBL
0.8000 mg | SUBLINGUAL_TABLET | Freq: Once | SUBLINGUAL | Status: AC
  Administered 2023-10-20: 0.8 mg via SUBLINGUAL

## 2023-10-20 MED ORDER — IOHEXOL 350 MG/ML SOLN
100.0000 mL | Freq: Once | INTRAVENOUS | Status: AC | PRN
  Administered 2023-10-20: 100 mL via INTRAVENOUS

## 2023-10-21 DIAGNOSIS — B078 Other viral warts: Secondary | ICD-10-CM | POA: Diagnosis not present

## 2023-10-21 DIAGNOSIS — D485 Neoplasm of uncertain behavior of skin: Secondary | ICD-10-CM | POA: Diagnosis not present

## 2023-10-24 ENCOUNTER — Ambulatory Visit: Payer: Self-pay | Admitting: Internal Medicine

## 2023-11-04 ENCOUNTER — Telehealth: Payer: Self-pay | Admitting: Cardiology

## 2023-11-04 NOTE — Telephone Encounter (Signed)
 Patient called in reporting she is having trouble with her cardiac monitor. Has been wearing for 3 weeks in the setting of PACs. Had trouble with poor skin contact and changed out the pads yesterday. Reports still having issues with contact today. Has a rash at the site of wear. Advised ok to DC the monitor at this time and send in what data she has. Will route to primary cardiologist to inform.

## 2023-11-07 ENCOUNTER — Encounter: Payer: Self-pay | Admitting: Internal Medicine

## 2023-11-08 ENCOUNTER — Ambulatory Visit: Attending: Internal Medicine

## 2023-11-08 DIAGNOSIS — R002 Palpitations: Secondary | ICD-10-CM

## 2023-11-10 DIAGNOSIS — R002 Palpitations: Secondary | ICD-10-CM | POA: Diagnosis not present

## 2023-11-10 DIAGNOSIS — I480 Paroxysmal atrial fibrillation: Secondary | ICD-10-CM

## 2023-11-10 NOTE — Telephone Encounter (Signed)
 Pt requesting a c/b to go over results. Please advise

## 2023-11-21 ENCOUNTER — Ambulatory Visit: Admitting: Physician Assistant

## 2024-01-10 ENCOUNTER — Ambulatory Visit: Admitting: Cardiology

## 2024-03-28 DIAGNOSIS — H5203 Hypermetropia, bilateral: Secondary | ICD-10-CM | POA: Diagnosis not present
